# Patient Record
Sex: Female | Born: 1955 | Race: White | Hispanic: No | Marital: Married | Smoking: Never smoker
Health system: Southern US, Community
[De-identification: ages and names within clinical notes are randomized; demographics above are authoritative.]

## PROBLEM LIST (undated history)

## (undated) DIAGNOSIS — E039 Hypothyroidism, unspecified: Secondary | ICD-10-CM

## (undated) DIAGNOSIS — E785 Hyperlipidemia, unspecified: Secondary | ICD-10-CM

## (undated) DIAGNOSIS — E079 Disorder of thyroid, unspecified: Secondary | ICD-10-CM

## (undated) DIAGNOSIS — N183 Chronic kidney disease, stage 3 unspecified: Secondary | ICD-10-CM

## (undated) DIAGNOSIS — I10 Essential (primary) hypertension: Secondary | ICD-10-CM

## (undated) DIAGNOSIS — R112 Nausea with vomiting, unspecified: Secondary | ICD-10-CM

## (undated) DIAGNOSIS — Z9889 Other specified postprocedural states: Secondary | ICD-10-CM

## (undated) DIAGNOSIS — D519 Vitamin B12 deficiency anemia, unspecified: Secondary | ICD-10-CM

## (undated) DIAGNOSIS — E119 Type 2 diabetes mellitus without complications: Secondary | ICD-10-CM

## (undated) DIAGNOSIS — Z86010 Personal history of colonic polyps: Secondary | ICD-10-CM

## (undated) DIAGNOSIS — R350 Frequency of micturition: Secondary | ICD-10-CM

## (undated) DIAGNOSIS — N390 Urinary tract infection, site not specified: Secondary | ICD-10-CM

## (undated) HISTORY — PX: BACK SURGERY: SHX140

## (undated) HISTORY — PX: URETER SURGERY: SHX823

## (undated) HISTORY — PX: ABDOMINAL HYSTERECTOMY: SHX81

## (undated) HISTORY — PX: COLONOSCOPY: SHX174

## (undated) HISTORY — DX: Disorder of thyroid, unspecified: E07.9

---

## 2006-12-22 HISTORY — PX: LUMBAR SPINE SURGERY: SHX701

## 2013-03-16 HISTORY — PX: LAPAROSCOPIC ASSISTED VAGINAL HYSTERECTOMY: SHX5398

## 2016-12-22 HISTORY — PX: CATARACT EXTRACTION W/ INTRAOCULAR LENS IMPLANT: SHX1309

## 2020-07-22 HISTORY — PX: COLONOSCOPY: SHX174

## 2020-07-28 ENCOUNTER — Other Ambulatory Visit: Payer: Self-pay

## 2020-07-28 ENCOUNTER — Emergency Department (HOSPITAL_BASED_OUTPATIENT_CLINIC_OR_DEPARTMENT_OTHER)
Admission: EM | Admit: 2020-07-28 | Discharge: 2020-07-28 | Disposition: A | Discharge status: Home / Self Care | Payer: BC Managed Care – PPO | Attending: Emergency Medicine | Admitting: Emergency Medicine

## 2020-07-28 ENCOUNTER — Encounter (HOSPITAL_BASED_OUTPATIENT_CLINIC_OR_DEPARTMENT_OTHER): Payer: Self-pay | Admitting: *Deleted

## 2020-07-28 ENCOUNTER — Emergency Department (HOSPITAL_BASED_OUTPATIENT_CLINIC_OR_DEPARTMENT_OTHER): Payer: BC Managed Care – PPO

## 2020-07-28 DIAGNOSIS — R0789 Other chest pain: Secondary | ICD-10-CM | POA: Diagnosis present

## 2020-07-28 DIAGNOSIS — M545 Low back pain: Secondary | ICD-10-CM | POA: Insufficient documentation

## 2020-07-28 DIAGNOSIS — E119 Type 2 diabetes mellitus without complications: Secondary | ICD-10-CM | POA: Diagnosis not present

## 2020-07-28 DIAGNOSIS — Z87891 Personal history of nicotine dependence: Secondary | ICD-10-CM | POA: Diagnosis not present

## 2020-07-28 DIAGNOSIS — I10 Essential (primary) hypertension: Secondary | ICD-10-CM | POA: Diagnosis not present

## 2020-07-28 HISTORY — DX: Essential (primary) hypertension: I10

## 2020-07-28 LAB — CBC WITH DIFFERENTIAL/PLATELET
Abs Immature Granulocytes: 0.03 10*3/uL (ref 0.00–0.07)
Basophils Absolute: 0.1 10*3/uL (ref 0.0–0.1)
Basophils Relative: 1 %
Eosinophils Absolute: 0.2 10*3/uL (ref 0.0–0.5)
Eosinophils Relative: 3 %
HCT: 42.1 % (ref 36.0–46.0)
Hemoglobin: 14 g/dL (ref 12.0–15.0)
Immature Granulocytes: 0 %
Lymphocytes Relative: 35 %
Lymphs Abs: 2.5 10*3/uL (ref 0.7–4.0)
MCH: 32.3 pg (ref 26.0–34.0)
MCHC: 33.3 g/dL (ref 30.0–36.0)
MCV: 97.2 fL (ref 80.0–100.0)
Monocytes Absolute: 0.6 10*3/uL (ref 0.1–1.0)
Monocytes Relative: 8 %
Neutro Abs: 3.7 10*3/uL (ref 1.7–7.7)
Neutrophils Relative %: 53 %
Platelets: 259 10*3/uL (ref 150–400)
RBC: 4.33 MIL/uL (ref 3.87–5.11)
RDW: 11.9 % (ref 11.5–15.5)
WBC: 7 10*3/uL (ref 4.0–10.5)
nRBC: 0 % (ref 0.0–0.2)

## 2020-07-28 LAB — COMPREHENSIVE METABOLIC PANEL
ALT: 25 U/L (ref 0–44)
AST: 19 U/L (ref 15–41)
Albumin: 4.4 g/dL (ref 3.5–5.0)
Alkaline Phosphatase: 68 U/L (ref 38–126)
Anion gap: 8 (ref 5–15)
BUN: 16 mg/dL (ref 8–23)
CO2: 26 mmol/L (ref 22–32)
Calcium: 9.3 mg/dL (ref 8.9–10.3)
Chloride: 103 mmol/L (ref 98–111)
Creatinine, Ser: 1.15 mg/dL — ABNORMAL HIGH (ref 0.44–1.00)
GFR calc Af Amer: 58 mL/min — ABNORMAL LOW (ref >60)
GFR calc non Af Amer: 50 mL/min — ABNORMAL LOW (ref >60)
Glucose, Bld: 128 mg/dL — ABNORMAL HIGH (ref 70–99)
Potassium: 3.4 mmol/L — ABNORMAL LOW (ref 3.5–5.1)
Sodium: 137 mmol/L (ref 135–145)
Total Bilirubin: 0.3 mg/dL (ref 0.3–1.2)
Total Protein: 7.9 g/dL (ref 6.5–8.1)

## 2020-07-28 LAB — TROPONIN I (HIGH SENSITIVITY)
Troponin I (High Sensitivity): 9 ng/L (ref <18)
Troponin I (High Sensitivity): 9 ng/L (ref <18)

## 2020-07-28 MED ORDER — NITROGLYCERIN 0.4 MG SL SUBL
0.4000 mg | SUBLINGUAL_TABLET | SUBLINGUAL | Status: DC | PRN
  Administered 2020-07-28: 0.4 mg via SUBLINGUAL
  Filled 2020-07-28: qty 1

## 2020-07-28 NOTE — ED Notes (Signed)
Having chest pain since yesterday am, feels pressure across chest a breast area, states when taking a deep breath, pain increases. Pain begins a center at chest and radiates to back.

## 2020-07-28 NOTE — ED Notes (Signed)
Taken to Xray at this time

## 2020-07-28 NOTE — ED Notes (Addendum)
Significant drop in BP after giving 1 ntg sl.  Denies pain unless moving around.  Encouraged to call for assistance as needed.  Provider made aware.

## 2020-07-28 NOTE — ED Provider Notes (Signed)
8:50 PM signout from Raymond PA-C at shift change.  Patient awaiting second troponin.  This returned at 9 and is unchanged from previous.  Patient updated on results.  Her chest pain is atypical.  She has pain at room might her when she had pleurisy in the past.  Is worse with deep breathing.  She denies shortness of breath.  She has not had vomiting, diaphoresis, or radiation of pain.  No lower extremity symptoms or clinical signs of DVT.  I personally reviewed EKG and chest x-ray.  Patient reports being under a lot of stress.  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  She thinks that a lot of her symptoms are related to stress.  At this point, she is comfortable discharged home.  Strongly encourage PCP follow-up for recheck of symptoms.  Patient was counseled to return with severe chest pain, especially if the pain is crushing or pressure-like and spreads to the arms, back, neck, or jaw, or if they have sweating, nausea, or shortness of breath with the pain. They were encouraged to call 911 with these symptoms.   They were also told to return if their chest pain gets worse and does not go away with rest, they have an attack of chest pain lasting longer than usual despite rest and treatment with the medications their caregiver has prescribed, if they wake from sleep with chest pain or shortness of breath, if they feel dizzy or faint, if they have chest pain not typical of their usual pain, or if they have any other emergent concerns regarding their health.  The patient verbalized understanding and agreed.   BP (!) 143/94 (BP Location: Right Arm)   Pulse 73   Temp 97.9 F (36.6 C) (Oral)   Resp 15   Ht 5\' 9"  (1.753 m)   Wt 70.3 kg   SpO2 100%   BMI 22.89 kg/m     , PA-C 07/28/20 2052    2053, MD 07/29/20 [phone]

## 2020-07-28 NOTE — ED Provider Notes (Signed)
[facility] EMERGENCY DEPARTMENT Provider Note   CSN: [id number] Arrival date & time: 07/28/20  1624     History Chief Complaint  Patient presents with  . Chest Pain    Danelia Snodgrass is a 64 y.o. female w PMHx T2DM, HTN, presenting to the ED with complaint of constant central chest pressure since yesterday morning. She states pain is worse with exertion, better with laying down and resting. Pain radiates to her back. Denies assoc nausea, diaphoresis, SOB. She feels like she is taking shallow breaths due to the discomfort, though is not short of breath. She states she has a hx of pleurisy and this feels similar. No recent illness, cough, fever. No prolonged immobilization, no new leg swelling pain. No hx DVT/PE. No anticoagulants.  She reports increased stress level/anxiety this summer.  The history is provided by the patient.    HPI: A 64 year old patient with a history of treated diabetes and hypertension presents for evaluation of chest pain. Initial onset of pain was more than 6 hours ago. The patient's chest pain is described as heaviness/pressure/tightness and is worse with exertion. The patient's chest pain is middle- or left-sided, is not well-localized, is not sharp and does not radiate to the arms/jaw/neck. The patient does not complain of nausea and denies diaphoresis. The patient has no history of stroke, has no history of peripheral artery disease, has not smoked in the past 90 days, has no relevant family history of coronary artery disease (first degree relative at less than age 95), has no history of hypercholesterolemia and does not have an elevated BMI (>=30).   Past Medical History:  Diagnosis Date  . Hypertension   . Thyroid disease     There are no problems to display for this patient.   Past Surgical History:  Procedure Laterality Date  . ABDOMINAL HYSTERECTOMY    . BACK SURGERY    . COLONOSCOPY    . URETER SURGERY       OB History   No obstetric  history on file.     No family history on file.  Social History   Tobacco Use  . Smoking status: Former Games developer  . Smokeless tobacco: Never Used  Vaping Use  . Vaping Use: Never used  Substance Use Topics  . Alcohol use: Yes  . Drug use: Never    Home Medications Prior to Admission medications   Medication Sig Start Date End Date Taking? Authorizing Provider  LIOTHYRONINE SODIUM PO Take by mouth.   Yes [provider]  zinc gluconate 50 MG tablet Take by mouth.    [provider]    Allergies    Patient has no known allergies.  Review of Systems   Review of Systems  All other systems reviewed and are negative.   Physical Exam Updated Vital Signs BP (!) 164/92 (BP Location: Right Arm)   Pulse 93   Temp 98.6 F (37 C) (Oral)   Resp 18   Ht 5\' 9"  (1.753 m)   Wt 70.3 kg   SpO2 100%   BMI 22.89 kg/m   Physical Exam Vitals and nursing note reviewed.  Constitutional:      General: She is not in acute distress.    Appearance: She is well-developed. She is not ill-appearing.  HENT:     Head: Normocephalic and atraumatic.  Eyes:     Conjunctiva/sclera: Conjunctivae normal.  Cardiovascular:     Rate and Rhythm: Normal rate and regular rhythm.  Heart sounds: Normal heart sounds.     Comments: Normal and equal radial and DP pulses b/l Pulmonary:     Effort: Pulmonary effort is normal. No respiratory distress.     Breath sounds: Normal breath sounds.     Comments: Pressing on chest improves symptoms Chest:     Chest wall: No tenderness.  Abdominal:     General: Bowel sounds are normal.     Palpations: Abdomen is soft.     Tenderness: There is no abdominal tenderness.  Musculoskeletal:     Right lower leg: No edema.     Left lower leg: No edema.     Comments: No calf TTP  Skin:    General: Skin is warm.  Neurological:     Mental Status: She is alert.  Psychiatric:        Behavior: Behavior normal.     ED Results / Procedures /  Treatments   Labs (all labs ordered are listed, but only abnormal results are displayed) Labs Reviewed  COMPREHENSIVE METABOLIC PANEL - Abnormal; Notable for the following components:      Result Value   Potassium 3.4 (*)    Glucose, Bld 128 (*)    Creatinine, Ser 1.15 (*)    GFR calc non Af Amer 50 (*)    GFR calc Af Amer 58 (*)    All other components within normal limits  CBC WITH DIFFERENTIAL/PLATELET  TROPONIN I (HIGH SENSITIVITY)  TROPONIN I (HIGH SENSITIVITY)    EKG EKG Interpretation  Date/Time:  Saturday July 28 2020 16:50:01 EDT Ventricular Rate:  80 PR Interval:  154 QRS Duration: 74 QT Interval:  368 QTC Calculation: 424 R Axis:   76 Text Interpretation: Normal sinus rhythm Nonspecific ST abnormality Abnormal ECG No previous ECGs available Confirmed by Richardean Canal [id number]) on 07/28/2020 6:42:32 PM   Radiology DG Chest 2 View  Result Date: 07/28/2020 CLINICAL DATA:  Chest pressure. EXAM: CHEST - 2 VIEW COMPARISON:  None. FINDINGS: There is no evidence of acute infiltrate, pleural effusion or pneumothorax. The heart size and mediastinal contours are within normal limits. The visualized skeletal structures are unremarkable. IMPRESSION: No active cardiopulmonary disease. Electronically Signed   By: Aram Candela M.D.   On: 07/28/2020 17:57    Procedures Procedures (including critical care time)  Medications Ordered in ED Medications  nitroGLYCERIN (NITROSTAT) SL tablet 0.4 mg (has no administration in time range)    ED Course  I have reviewed the triage vital signs and the nursing notes.  Pertinent labs & imaging results that were available during my care of the patient were reviewed by me and considered in my medical decision making (see chart for details).    MDM Rules/Calculators/A&P HEAR Score: 4                        Pt presenting with constant central chest pressure that began yesterday morning. Pain is worse with exertion and better with rest,  radiates to her back.  No associated nausea or diaphoresis.  She endorses increased level of stress and anxiety over the summer.  Low risk [place], no recent immobilization or surgery.  No unilateral leg pain or swelling, no history of DVT or PE.   On exam, patient is well-appearing and in no distress.  Heart and lung sounds are normal.  No chest tenderness though symptoms be improving with chest palpation.  No leg swelling or calf tenderness.  Equal pulses both radial and  DP.  Cardiac work-up thus far reveals negative chest x-ray, negative troponin of 9, creatinine 1.15.  EKG with nonspecific ST changes, though unsure of baseline as no previous to compare.  Hear score of 4, per care everywhere PCP diagnosed with type 2 diabetes and hypertension.  Patient reports type 2 diabetes was a new diagnosis and she is managing with diet at this point.    Concern for possible angina as cause of patients pain. Also consider anxiety. Low suspicion for PE or dissection.  Care handed off at shift change pending delta troponin. PA Geiple to assume care and determine appropriate disposition.  Final Clinical Impression(s) / ED Diagnoses  Final diagnoses:      Rx / DC Orders ED Discharge Orders    None       Erilyn Pearman, [place] N, PA-C 07/28/20 1911    Charlynne Pander, MD 07/29/20 [phone]

## 2020-07-28 NOTE — ED Triage Notes (Addendum)
Pt reports she had a colonoscopy this week on Thursday and felt fine after. Since Friday she states she's been having chest pressure which radiates into her back. Reports it feels like pleurisy and she thinks it is related to the colonoscopy. Pt awake, alert, NAD. Ambulated with quick steady gait to triage

## 2020-07-28 NOTE — ED Notes (Signed)
Placed on cont cardiac monitoring with cont POX and int NBP assessments 

## 2020-07-28 NOTE — Discharge Instructions (Addendum)
Please read instructions below. Your chest x-ray did not show any signs of pneumonia or other problems with your lungs.  Blood test to check for heart muscle irritation were both normal.  Your EKG did not show any signs of heart attack. Follow up with your primary care provider this week. Return to the ER for new or worsening symptoms; including worsening chest pain, shortness of breath, pain that radiates to the arm or neck, pain or shortness of breath worsened with exertion.

## 2020-07-28 NOTE — ED Notes (Signed)
Denies N/V/D, denies fever, denies lightheadiness as well, denies having a cough,

## 2021-04-26 IMAGING — CR DG CHEST 2V
2 series · 2 of 2 positions shown · non-contrast
Comparison: None.

CLINICAL DATA: Chest pressure.

EXAM:
CHEST - 2 VIEW

[w chest pa]
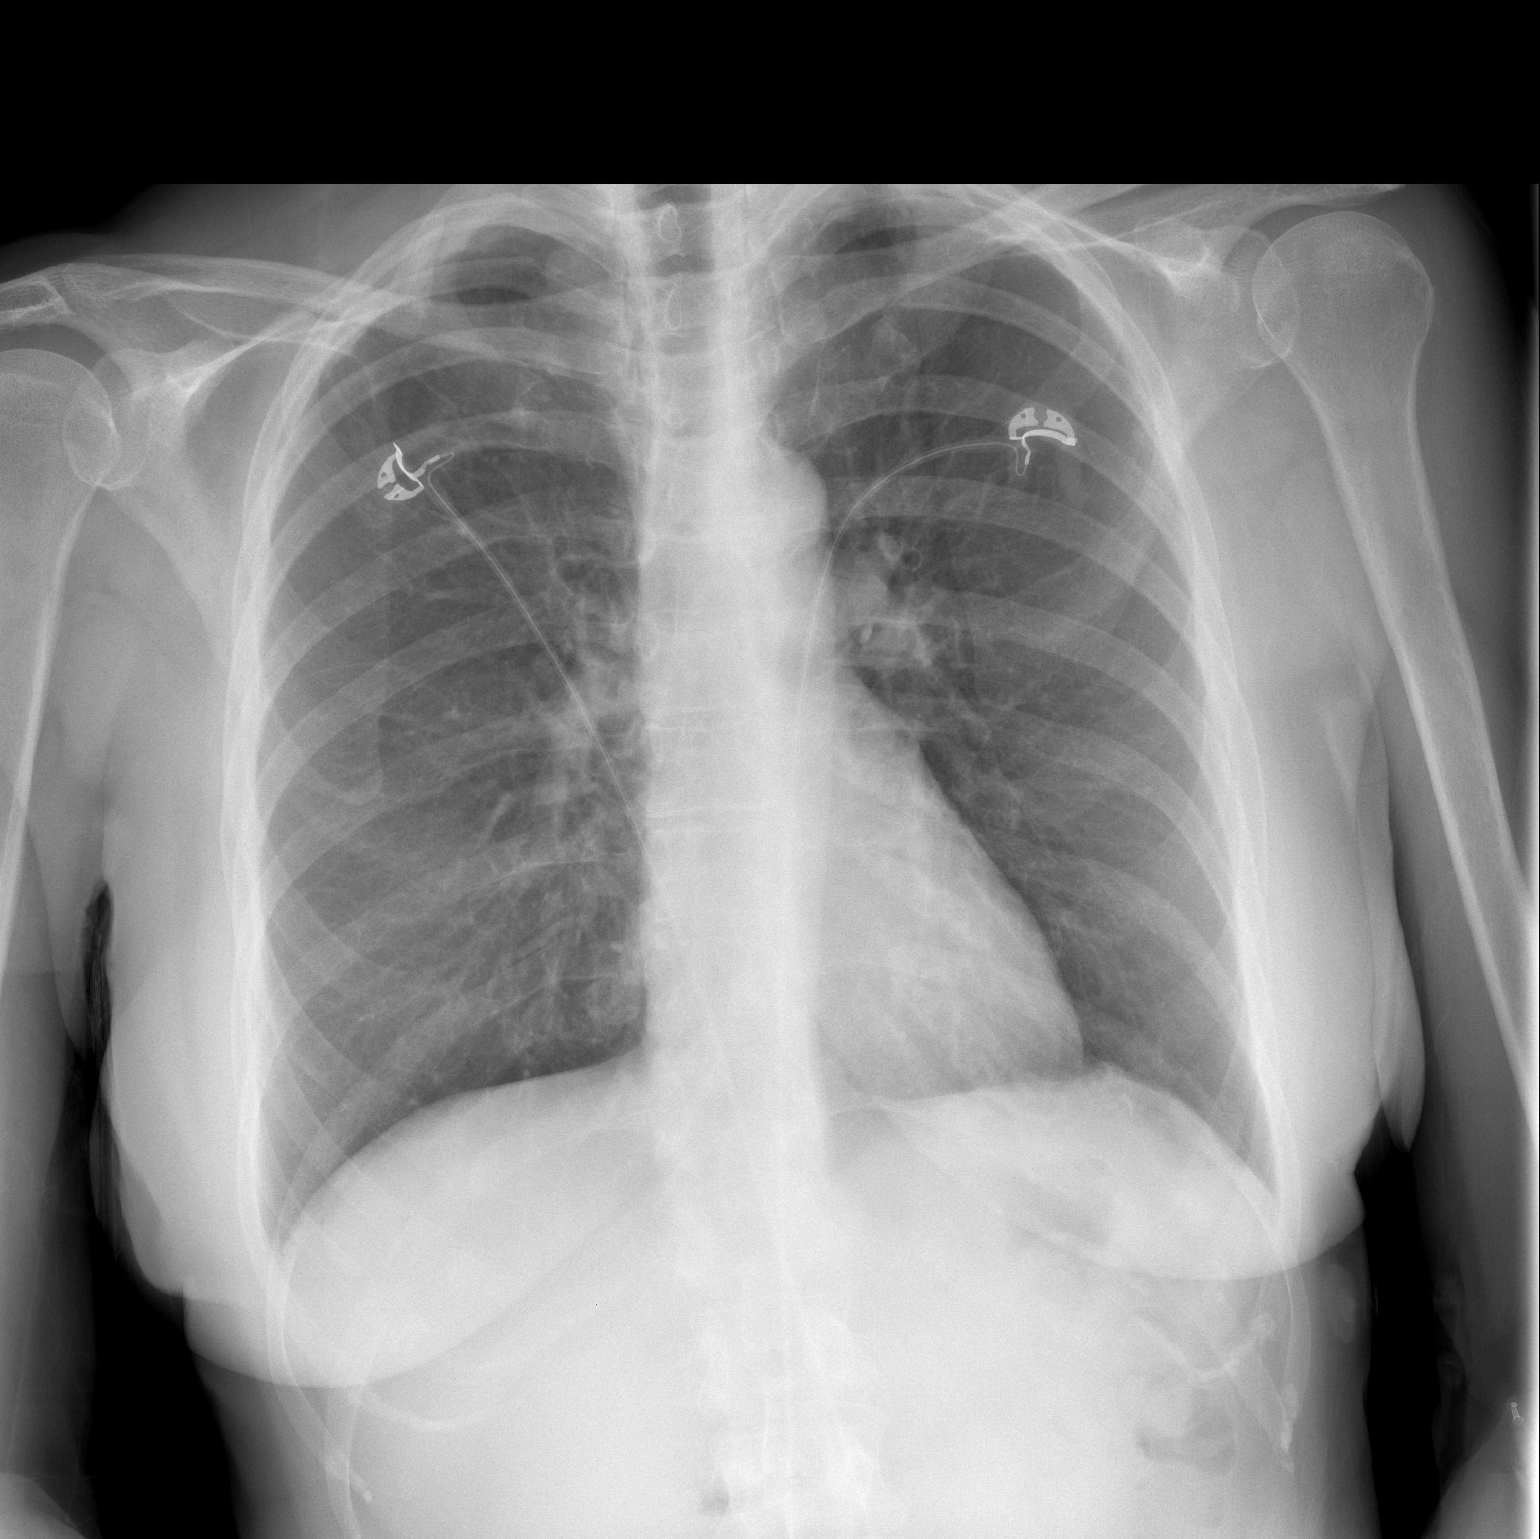

[w chest lat]
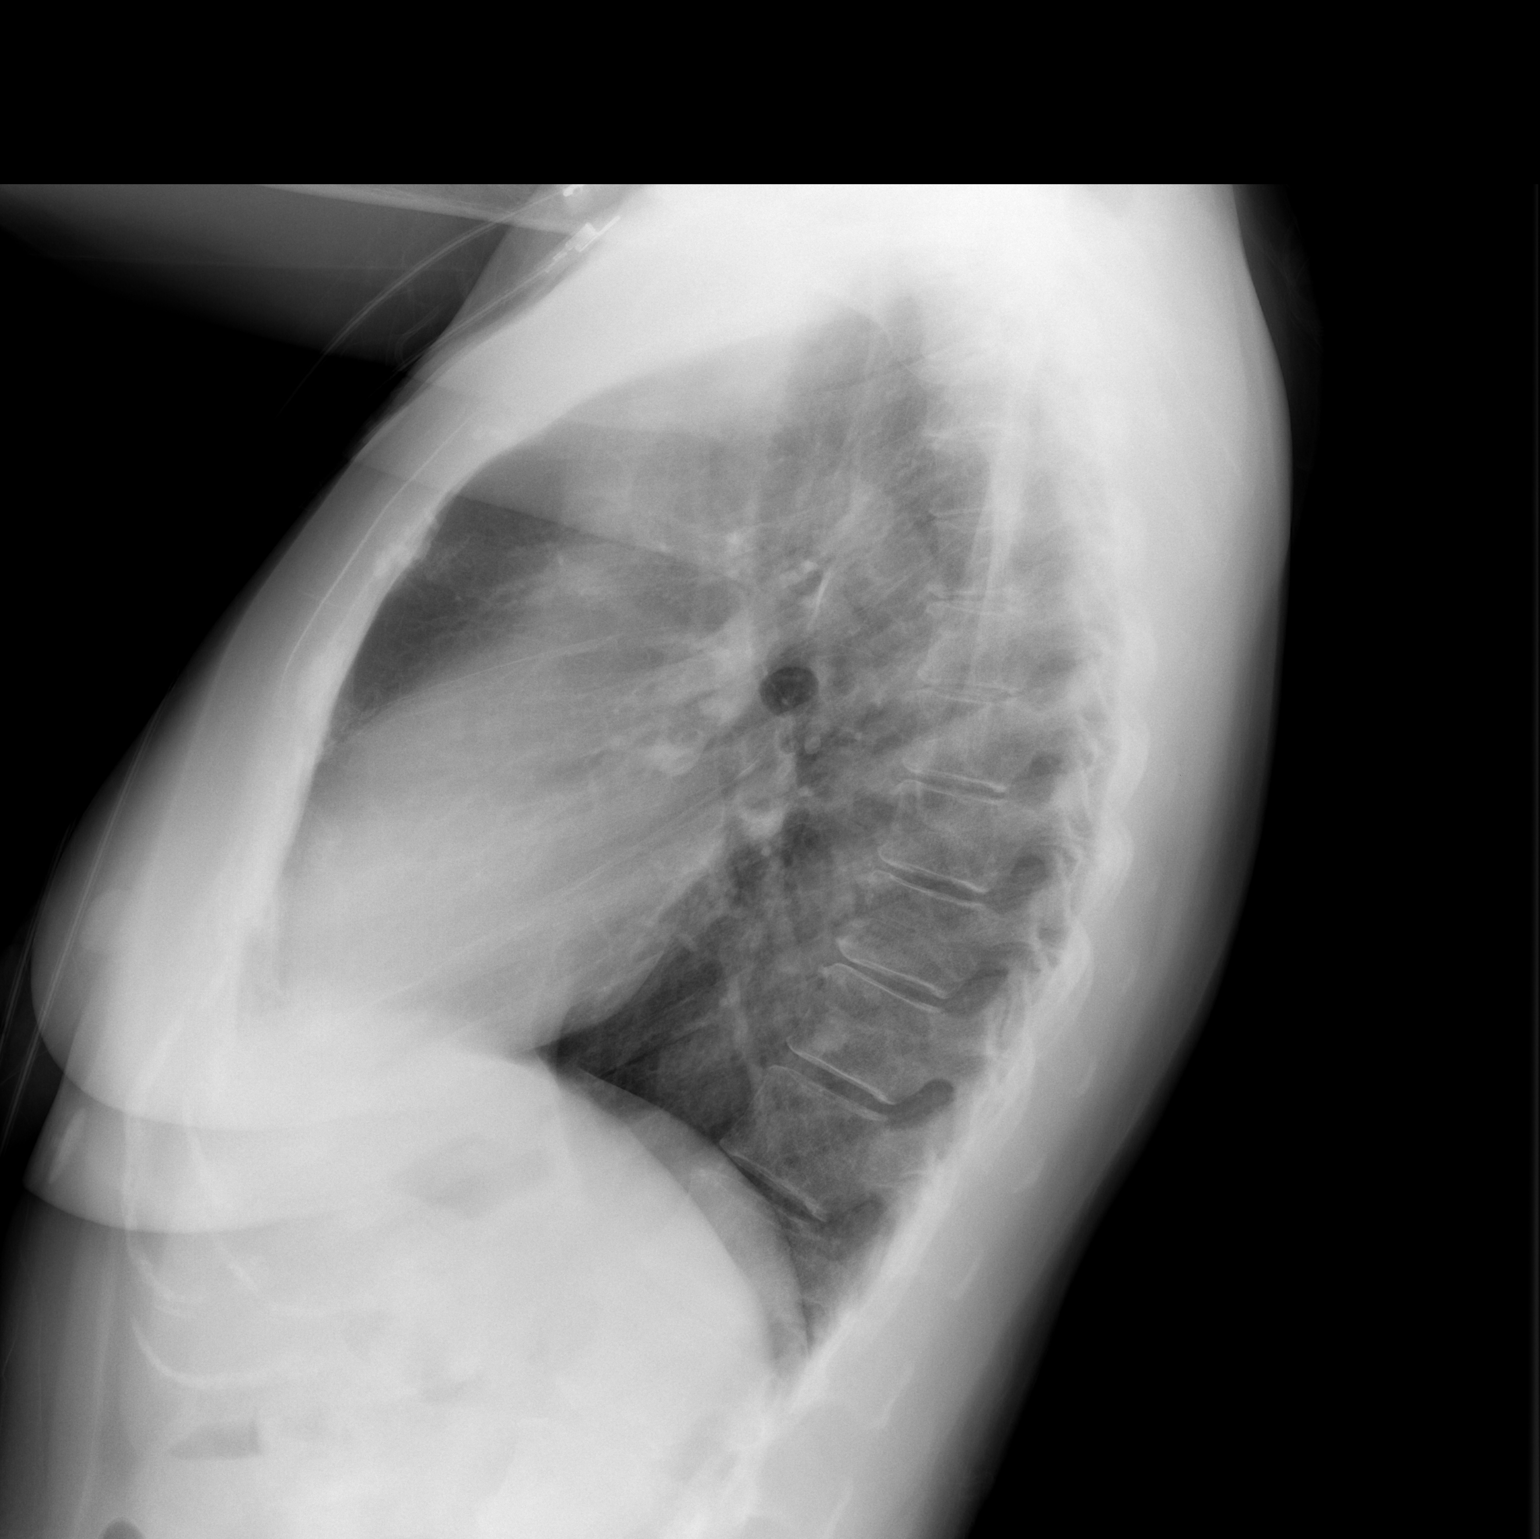

[2 of 2 positions shown; findings below may reference images not displayed]

FINDINGS: There is no evidence of acute infiltrate, pleural effusion or
pneumothorax. The heart size and mediastinal contours are within
normal limits. The visualized skeletal structures are unremarkable.
IMPRESSION: No active cardiopulmonary disease.

## 2021-07-22 ENCOUNTER — Emergency Department (HOSPITAL_BASED_OUTPATIENT_CLINIC_OR_DEPARTMENT_OTHER)
Admission: EM | Admit: 2021-07-22 | Discharge: 2021-07-22 | Disposition: A | Discharge status: Home / Self Care | Payer: Medicare Other | Attending: Emergency Medicine | Admitting: Emergency Medicine

## 2021-07-22 ENCOUNTER — Encounter (HOSPITAL_BASED_OUTPATIENT_CLINIC_OR_DEPARTMENT_OTHER): Payer: Self-pay

## 2021-07-22 ENCOUNTER — Other Ambulatory Visit: Payer: Self-pay

## 2021-07-22 DIAGNOSIS — R0602 Shortness of breath: Secondary | ICD-10-CM | POA: Diagnosis present

## 2021-07-22 DIAGNOSIS — I10 Essential (primary) hypertension: Secondary | ICD-10-CM | POA: Diagnosis not present

## 2021-07-22 DIAGNOSIS — Z87891 Personal history of nicotine dependence: Secondary | ICD-10-CM | POA: Insufficient documentation

## 2021-07-22 DIAGNOSIS — Z8616 Personal history of COVID-19: Secondary | ICD-10-CM

## 2021-07-22 DIAGNOSIS — M5431 Sciatica, right side: Secondary | ICD-10-CM | POA: Diagnosis not present

## 2021-07-22 DIAGNOSIS — U071 COVID-19: Secondary | ICD-10-CM | POA: Insufficient documentation

## 2021-07-22 DIAGNOSIS — R1031 Right lower quadrant pain: Secondary | ICD-10-CM | POA: Diagnosis not present

## 2021-07-22 HISTORY — DX: Personal history of COVID-19: Z86.16

## 2021-07-22 LAB — CBC WITH DIFFERENTIAL/PLATELET
Abs Immature Granulocytes: 0.01 10*3/uL (ref 0.00–0.07)
Basophils Absolute: 0 10*3/uL (ref 0.0–0.1)
Basophils Relative: 1 %
Eosinophils Absolute: 0.1 10*3/uL (ref 0.0–0.5)
Eosinophils Relative: 1 %
HCT: 41.6 % (ref 36.0–46.0)
Hemoglobin: 14.3 g/dL (ref 12.0–15.0)
Immature Granulocytes: 0 %
Lymphocytes Relative: 23 %
Lymphs Abs: 1.1 10*3/uL (ref 0.7–4.0)
MCH: 32.4 pg (ref 26.0–34.0)
MCHC: 34.4 g/dL (ref 30.0–36.0)
MCV: 94.3 fL (ref 80.0–100.0)
Monocytes Absolute: 0.8 10*3/uL (ref 0.1–1.0)
Monocytes Relative: 15 %
Neutro Abs: 2.9 10*3/uL (ref 1.7–7.7)
Neutrophils Relative %: 60 %
Platelets: 227 10*3/uL (ref 150–400)
RBC: 4.41 MIL/uL (ref 3.87–5.11)
RDW: 11.9 % (ref 11.5–15.5)
WBC: 4.9 10*3/uL (ref 4.0–10.5)
nRBC: 0 % (ref 0.0–0.2)

## 2021-07-22 LAB — COMPREHENSIVE METABOLIC PANEL
ALT: 20 U/L (ref 0–44)
AST: 21 U/L (ref 15–41)
Albumin: 3.8 g/dL (ref 3.5–5.0)
Alkaline Phosphatase: 65 U/L (ref 38–126)
Anion gap: 9 (ref 5–15)
BUN: 17 mg/dL (ref 8–23)
CO2: 22 mmol/L (ref 22–32)
Calcium: 8.5 mg/dL — ABNORMAL LOW (ref 8.9–10.3)
Chloride: 103 mmol/L (ref 98–111)
Creatinine, Ser: 1.31 mg/dL — ABNORMAL HIGH (ref 0.44–1.00)
GFR, Estimated: 45 mL/min — ABNORMAL LOW (ref >60)
Glucose, Bld: 206 mg/dL — ABNORMAL HIGH (ref 70–99)
Potassium: 4 mmol/L (ref 3.5–5.1)
Sodium: 134 mmol/L — ABNORMAL LOW (ref 135–145)
Total Bilirubin: 0.5 mg/dL (ref 0.3–1.2)
Total Protein: 7.2 g/dL (ref 6.5–8.1)

## 2021-07-22 LAB — RESP PANEL BY RT-PCR (FLU A&B, COVID) ARPGX2
Influenza A by PCR: NEGATIVE
Influenza B by PCR: NEGATIVE
SARS Coronavirus 2 by RT PCR: POSITIVE — AB

## 2021-07-22 LAB — TSH: TSH: 0.724 u[IU]/mL (ref 0.350–4.500)

## 2021-07-22 LAB — D-DIMER, QUANTITATIVE: D-Dimer, Quant: <0.27 ug/mL-FEU (ref 0.00–0.50)

## 2021-07-22 MED ORDER — MORPHINE SULFATE (PF) 4 MG/ML IV SOLN
4.0000 mg | Freq: Once | INTRAVENOUS | Status: AC
  Administered 2021-07-22: 4 mg via INTRAVENOUS
  Filled 2021-07-22: qty 1

## 2021-07-22 MED ORDER — DEXAMETHASONE SODIUM PHOSPHATE 10 MG/ML IJ SOLN
10.0000 mg | Freq: Once | INTRAMUSCULAR | Status: AC
  Administered 2021-07-22: 10 mg via INTRAVENOUS
  Filled 2021-07-22: qty 1

## 2021-07-22 MED ORDER — PAXLOVID 10 X 150 MG & 10 X 100MG PO TBPK: 2.0000 | ORAL_TABLET | Freq: Two times a day (BID) | ORAL | 0 refills | Status: AC

## 2021-07-22 MED ORDER — SODIUM CHLORIDE 0.9 % IV BOLUS
500.0000 mL | Freq: Once | INTRAVENOUS | Status: AC
  Administered 2021-07-22: 500 mL via INTRAVENOUS

## 2021-07-22 MED ORDER — ONDANSETRON HCL 4 MG/2ML IJ SOLN
4.0000 mg | Freq: Once | INTRAMUSCULAR | Status: AC
  Administered 2021-07-22: 4 mg via INTRAVENOUS
  Filled 2021-07-22: qty 2

## 2021-07-22 MED ORDER — ONDANSETRON 4 MG PO TBDP
4.0000 mg | ORAL_TABLET | Freq: Three times a day (TID) | ORAL | 0 refills | Status: DC | PRN
Start: 2021-07-22 — End: 2022-05-01

## 2021-07-22 MED ORDER — HYDROCODONE-ACETAMINOPHEN 5-325 MG PO TABS: 1.0000 | ORAL_TABLET | ORAL | 0 refills | Status: DC | PRN

## 2021-07-22 NOTE — ED Triage Notes (Signed)
Pt c/o pain to right buttock/hip, right LE x 1 mont-states pain worse and right groin pain x 4 days-denies injury-NAD-steady gait

## 2021-07-22 NOTE — Discharge Instructions (Addendum)
Person Under Monitoring Name: Mariah Russell  Location: [address] [place] [address]   Infection Prevention Recommendations for Individuals Confirmed to have, or Being Evaluated for, 2019 Novel Coronavirus (COVID-19) Infection Who Receive Care at Home  Individuals who are confirmed to have, or are being evaluated for, COVID-19 should follow the prevention steps below until a healthcare provider or local or state health department says they can return to normal activities.  Stay home except to get medical care You should restrict activities outside your home, except for getting medical care. Do not go to work, school, or public areas, and do not use public transportation or taxis.  Call ahead before visiting your doctor Before your medical appointment, call the healthcare provider and tell them that you have, or are being evaluated for, COVID-19 infection. This will help the healthcare provider's office take steps to keep other people from getting infected. Ask your healthcare provider to call the local or state health department.  Monitor your symptoms Seek prompt medical attention if your illness is worsening (e.g., difficulty breathing). Before going to your medical appointment, call the healthcare provider and tell them that you have, or are being evaluated for, COVID-19 infection. Ask your healthcare provider to call the local or state health department.  Wear a facemask You should wear a facemask that covers your nose and mouth when you are in the same room with other people and when you visit a healthcare provider. People who live with or visit you should also wear a facemask while they are in the same room with you.  Separate yourself from other people in your home As much as possible, you should stay in a different room from other people in your home. Also, you should use a separate bathroom, if available.  Avoid sharing household items You should not  share dishes, drinking glasses, cups, eating utensils, towels, bedding, or other items with other people in your home. After using these items, you should wash them thoroughly with soap and water.  Cover your coughs and sneezes Cover your mouth and nose with a tissue when you cough or sneeze, or you can cough or sneeze into your sleeve. Throw used tissues in a lined trash can, and immediately wash your hands with soap and water for at least 20 seconds or use an alcohol-based hand rub.  Wash your [employer] your hands often and thoroughly with soap and water for at least 20 seconds. You can use an alcohol-based hand sanitizer if soap and water are not available and if your hands are not visibly dirty. Avoid touching your eyes, nose, and mouth with unwashed hands.   Prevention Steps for Caregivers and Household Members of Individuals Confirmed to have, or Being Evaluated for, COVID-19 Infection Being Cared for in the Home  If you live with, or provide care at home for, a person confirmed to have, or being evaluated for, COVID-19 infection please follow these guidelines to prevent infection:  Follow healthcare provider's instructions Make sure that you understand and can help the patient follow any healthcare provider instructions for all care.  Provide for the patient's basic needs You should help the patient with basic needs in the home and provide support for getting groceries, prescriptions, and other personal needs.  Monitor the patient's symptoms If they are getting sicker, call his or her medical provider and tell them that the patient has, or is being evaluated for, COVID-19 infection. This will help the healthcare provider's office  take steps to keep other people from getting infected. Ask the healthcare provider to call the local or state health department.  Limit the number of people who have contact with the patient If possible, have only one caregiver for the  patient. Other household members should stay in another home or place of residence. If this is not possible, they should stay in another room, or be separated from the patient as much as possible. Use a separate bathroom, if available. Restrict visitors who do not have an essential need to be in the home.  Keep older adults, very young children, and other sick people away from the patient Keep older adults, very young children, and those who have compromised immune systems or chronic health conditions away from the patient. This includes people with chronic heart, lung, or kidney conditions, diabetes, and cancer.  Ensure good ventilation Make sure that shared spaces in the home have good air flow, such as from an air conditioner or an opened window, weather permitting.  Wash your hands often Wash your hands often and thoroughly with soap and water for at least 20 seconds. You can use an alcohol based hand sanitizer if soap and water are not available and if your hands are not visibly dirty. Avoid touching your eyes, nose, and mouth with unwashed hands. Use disposable paper towels to dry your hands. If not available, use dedicated cloth towels and replace them when they become wet.  Wear a facemask and gloves Wear a disposable facemask at all times in the room and gloves when you touch or have contact with the patient's blood, body fluids, and/or secretions or excretions, such as sweat, saliva, sputum, nasal mucus, vomit, urine, or feces.  Ensure the mask fits over your nose and mouth tightly, and do not touch it during use. Throw out disposable facemasks and gloves after using them. Do not reuse. Wash your hands immediately after removing your facemask and gloves. If your personal clothing becomes contaminated, carefully remove clothing and launder. Wash your hands after handling contaminated clothing. Place all used disposable facemasks, gloves, and other waste in a lined container before  disposing them with other household waste. Remove gloves and wash your hands immediately after handling these items.  Do not share dishes, glasses, or other household items with the patient Avoid sharing household items. You should not share dishes, drinking glasses, cups, eating utensils, towels, bedding, or other items with a patient who is confirmed to have, or being evaluated for, COVID-19 infection. After the person uses these items, you should wash them thoroughly with soap and water.  Wash laundry thoroughly Immediately remove and wash clothes or bedding that have blood, body fluids, and/or secretions or excretions, such as sweat, saliva, sputum, nasal mucus, vomit, urine, or feces, on them. Wear gloves when handling laundry from the patient. Read and follow directions on labels of laundry or clothing items and detergent. In general, wash and dry with the warmest temperatures recommended on the label.  Clean all areas the individual has used often Clean all touchable surfaces, such as counters, tabletops, doorknobs, bathroom fixtures, toilets, phones, keyboards, tablets, and bedside tables, every day. Also, clean any surfaces that may have blood, body fluids, and/or secretions or excretions on them. Wear gloves when cleaning surfaces the patient has come in contact with. Use a diluted bleach solution (e.g., dilute bleach with 1 part bleach and 10 parts water) or a household disinfectant with a label that says EPA-registered for coronaviruses. To make a bleach  solution at home, add 1 tablespoon of bleach to 1 quart (4 cups) of water. For a larger supply, add  cup of bleach to 1 gallon (16 cups) of water. Read labels of cleaning products and follow recommendations provided on product labels. Labels contain instructions for safe and effective use of the cleaning product including precautions you should take when applying the product, such as wearing gloves or eye protection and making sure you  have good ventilation during use of the product. Remove gloves and wash hands immediately after cleaning.  Monitor yourself for signs and symptoms of illness Caregivers and household members are considered close contacts, should monitor their health, and will be asked to limit movement outside of the home to the extent possible. Follow the monitoring steps for close contacts listed on the symptom monitoring form.   ? If you have additional questions, contact your local health department or call the epidemiologist on call at [phone] (available 24/7). ? This guidance is subject to change. For the most up-to-date guidance from [facility], please refer to their website: [email or web address]

## 2021-07-22 NOTE — ED Provider Notes (Addendum)
[facility] EMERGENCY DEPARTMENT Provider Note   CSN: [id number] Arrival date & time: 07/22/21  1302     History Chief Complaint  Patient presents with   Multiple c/o    Mariah Russell is a 65 y.o. female.  Pt presents to the ED today with pain in right hip which is radiating down her right leg.  It has been going on for about 1 month, but it is getting worse.  Pt has some pain in her right groin.  She feels a little sob.  She took an old oxycodone on Saturday (7/30) and that made her throw up.  She said she called her doctor to be seen, but they could not see her until January.  Pt also feels a little sob, but thinks that is from the pain.      Past Medical History:  Diagnosis Date   Hypertension    Thyroid disease     There are no problems to display for this patient.   Past Surgical History:  Procedure Laterality Date   ABDOMINAL HYSTERECTOMY     BACK SURGERY     COLONOSCOPY     URETER SURGERY       OB History   No obstetric history on file.     No family history on file.  Social History   Tobacco Use   Smoking status: Former   Smokeless tobacco: Never  Building services engineer Use: Never used  Substance Use Topics   Alcohol use: Yes    Comment: weekly   Drug use: Never    Home Medications Prior to Admission medications   Medication Sig Start Date End Date Taking? Authorizing Provider  HYDROcodone-acetaminophen (NORCO/VICODIN) 5-325 MG tablet Take 1 tablet by mouth every 4 (four) hours as needed. 07/22/21  Yes Jacalyn Lefevre, MD  nirmatrelvir/ritonavir EUA, renal dosing, (PAXLOVID) TBPK Take 2 tablets by mouth 2 (two) times daily for 5 days. Patient GFR is 45. Take nirmatrelvir (150 mg) one tablet twice daily for 5 days and ritonavir (100 mg) one tablet twice daily for 5 days. 07/22/21 07/27/21 Yes Jacalyn Lefevre, MD  ondansetron (ZOFRAN ODT) 4 MG disintegrating tablet Take 1 tablet (4 mg total) by mouth every 8 (eight) hours as needed for nausea or  vomiting. 07/22/21  Yes Jacalyn Lefevre, MD  LIOTHYRONINE SODIUM PO Take by mouth.    [provider]  zinc gluconate 50 MG tablet Take by mouth.    [provider]    Allergies    Patient has no known allergies.  Review of Systems   Review of Systems  Gastrointestinal:  Positive for abdominal pain.  Neurological:        Pain radiating down right leg  All other systems reviewed and are negative.  Physical Exam Updated Vital Signs BP (!) 148/88 (BP Location: Left Arm)   Pulse 92   Temp 98.3 F (36.8 C) (Oral)   Resp 20   Ht 5\' 9"  (1.753 m)   Wt 73 kg   SpO2 98%   BMI 23.78 kg/m   Physical Exam Vitals and nursing note reviewed.  Constitutional:      Appearance: Normal appearance.  HENT:     Head: Normocephalic and atraumatic.     Right Ear: External ear normal.     Left Ear: External ear normal.     Nose: Nose normal.     Mouth/Throat:     Mouth: Mucous membranes are moist.     Pharynx: Oropharynx is  clear.  Eyes:     Extraocular Movements: Extraocular movements intact.     Conjunctiva/sclera: Conjunctivae normal.     Pupils: Pupils are equal, round, and reactive to light.  Cardiovascular:     Rate and Rhythm: Normal rate and regular rhythm.     Pulses: Normal pulses.     Heart sounds: Normal heart sounds.  Pulmonary:     Effort: Pulmonary effort is normal.     Breath sounds: Normal breath sounds.  Abdominal:     General: Abdomen is flat. Bowel sounds are normal.     Palpations: Abdomen is soft.  Musculoskeletal:        General: Normal range of motion.     Cervical back: Normal range of motion and neck supple.  Skin:    General: Skin is warm.     Capillary Refill: Capillary refill takes less than 2 seconds.  Neurological:     General: No focal deficit present.     Mental Status: She is alert and oriented to person, place, and time.  Psychiatric:        Mood and Affect: Mood normal.        Behavior: Behavior normal.        Thought  Content: Thought content normal.        Judgment: Judgment normal.    ED Results / Procedures / Treatments   Labs (all labs ordered are listed, but only abnormal results are displayed) Labs Reviewed  RESP PANEL BY RT-PCR (FLU A&B, COVID) ARPGX2 - Abnormal; Notable for the following components:      Result Value   SARS Coronavirus 2 by RT PCR POSITIVE (*)    All other components within normal limits  COMPREHENSIVE METABOLIC PANEL - Abnormal; Notable for the following components:   Sodium 134 (*)    Glucose, Bld 206 (*)    Creatinine, Ser 1.31 (*)    Calcium 8.5 (*)    GFR, Estimated 45 (*)    All other components within normal limits  CBC WITH DIFFERENTIAL/PLATELET  D-DIMER, QUANTITATIVE  URINALYSIS, ROUTINE W REFLEX MICROSCOPIC  TSH    EKG None  Radiology No results found.  Procedures Procedures   Medications Ordered in ED Medications  sodium chloride 0.9 % bolus 500 mL (500 mLs Intravenous New Bag/Given 07/22/21 1345)  dexamethasone (DECADRON) injection 10 mg (10 mg Intravenous Given 07/22/21 1344)  ondansetron (ZOFRAN) injection 4 mg (4 mg Intravenous Given 07/22/21 1344)  morphine 4 MG/ML injection 4 mg (4 mg Intravenous Given 07/22/21 1344)    ED Course  I have reviewed the triage vital signs and the nursing notes.  Pertinent labs & imaging results that were available during my care of the patient were reviewed by me and considered in my medical decision making (see chart for details).    MDM Rules/Calculators/A&P                           Pt is Covid +.  She has been vaccinated + 2 boosters.  She may have gotten it from her fiancee.  She is not hypoxic and is nontoxic.    BS is mildly elevated.  She has no hx of dm.  She is told that she needs to get that repeated by her pcp when she is well.  I am not going to send her home with steroids due to this.  Pt's right leg pain is likely sciatica and exacerbated by the Covid.  Pt is feeling better.  She is stable for  d/c.  She is to return if worse.  F/u with pcp.  Mariah Russell was evaluated in Emergency Department on 07/22/2021 for the symptoms described in the history of present illness. She was evaluated in the context of the global COVID-19 pandemic, which necessitated consideration that the patient might be at risk for infection with the SARS-CoV-2 virus that causes COVID-19. Institutional protocols and algorithms that pertain to the evaluation of patients at risk for COVID-19 are in a state of rapid change based on information released by regulatory bodies including the CDC and federal and state organizations. These policies and algorithms were followed during the patient's care in the ED.   Final Clinical Impression(s) / ED Diagnoses Final diagnoses:  COVID-19  Sciatica of right side    Rx / DC Orders ED Discharge Orders          Ordered    nirmatrelvir/ritonavir EUA, renal dosing, (PAXLOVID) TBPK  2 times daily        07/22/21 1503    HYDROcodone-acetaminophen (NORCO/VICODIN) 5-325 MG tablet  Every 4 hours PRN        07/22/21 1503    ondansetron (ZOFRAN ODT) 4 MG disintegrating tablet  Every 8 hours PRN        07/22/21 1503             Jacalyn Lefevre, MD 07/22/21 1507    Jacalyn Lefevre, MD 07/22/21 [phone]

## 2022-05-01 ENCOUNTER — Other Ambulatory Visit: Payer: Self-pay

## 2022-05-01 ENCOUNTER — Encounter (HOSPITAL_BASED_OUTPATIENT_CLINIC_OR_DEPARTMENT_OTHER): Payer: Self-pay | Admitting: Podiatry

## 2022-05-01 NOTE — Progress Notes (Signed)
Spoke w/ via phone for pre-op interview--- pt ?Lab needs dos----  [employer] , ekg             ?Lab results------ no ?COVID test -----patient states asymptomatic no test needed ?Arrive at ------- 1100 on 05-06-2022 ?NPO after MN NO Solid Food.  Clear liquids from MN until--- 1000 ?Med rec completed ?Medications to take morning of surgery ----- liothyronine ?Diabetic medication ----- take metformin night before as usual ?Patient instructed no nail polish to be worn day of surgery ?Patient instructed to bring photo id and insurance card day of surgery ?Patient aware to have Driver (ride ) / caregiver for 24 hours after surgery --husband, woodie caine ?Patient Special Instructions ----- n/a ?Pre-Op special Istructions ----- received pt's pcp, Dr Tonny Bollman, H&P dated 04-21-2022 via fax from Dr Harriet Pho office, in chart. ?Patient verbalized understanding of instructions that were given at this phone interview. ?Patient denies shortness of breath, chest pain, fever, cough at this phone interview.  ?

## 2022-05-06 ENCOUNTER — Ambulatory Visit (HOSPITAL_BASED_OUTPATIENT_CLINIC_OR_DEPARTMENT_OTHER): Payer: Medicare Other | Admitting: Anesthesiology

## 2022-05-06 ENCOUNTER — Encounter (HOSPITAL_BASED_OUTPATIENT_CLINIC_OR_DEPARTMENT_OTHER)
Admission: RE | Disposition: A | Discharge status: Home / Self Care | Payer: Self-pay | Source: Home / Self Care | Attending: Podiatry

## 2022-05-06 ENCOUNTER — Encounter (HOSPITAL_BASED_OUTPATIENT_CLINIC_OR_DEPARTMENT_OTHER): Payer: Self-pay | Admitting: Podiatry

## 2022-05-06 ENCOUNTER — Ambulatory Visit (HOSPITAL_BASED_OUTPATIENT_CLINIC_OR_DEPARTMENT_OTHER)
Admission: RE | Admit: 2022-05-06 | Discharge: 2022-05-06 | Disposition: A | Discharge status: Home / Self Care | Payer: Medicare Other | Attending: Podiatry | Admitting: Podiatry

## 2022-05-06 ENCOUNTER — Other Ambulatory Visit: Payer: Self-pay

## 2022-05-06 ENCOUNTER — Ambulatory Visit (HOSPITAL_BASED_OUTPATIENT_CLINIC_OR_DEPARTMENT_OTHER): Payer: Medicare Other

## 2022-05-06 DIAGNOSIS — E1122 Type 2 diabetes mellitus with diabetic chronic kidney disease: Secondary | ICD-10-CM | POA: Insufficient documentation

## 2022-05-06 DIAGNOSIS — E119 Type 2 diabetes mellitus without complications: Secondary | ICD-10-CM

## 2022-05-06 DIAGNOSIS — I10 Essential (primary) hypertension: Secondary | ICD-10-CM | POA: Diagnosis not present

## 2022-05-06 DIAGNOSIS — M25571 Pain in right ankle and joints of right foot: Secondary | ICD-10-CM | POA: Insufficient documentation

## 2022-05-06 DIAGNOSIS — M25871 Other specified joint disorders, right ankle and foot: Secondary | ICD-10-CM

## 2022-05-06 DIAGNOSIS — I129 Hypertensive chronic kidney disease with stage 1 through stage 4 chronic kidney disease, or unspecified chronic kidney disease: Secondary | ICD-10-CM | POA: Diagnosis not present

## 2022-05-06 DIAGNOSIS — Z01818 Encounter for other preprocedural examination: Secondary | ICD-10-CM

## 2022-05-06 DIAGNOSIS — Z7984 Long term (current) use of oral hypoglycemic drugs: Secondary | ICD-10-CM

## 2022-05-06 DIAGNOSIS — N183 Chronic kidney disease, stage 3 unspecified: Secondary | ICD-10-CM | POA: Diagnosis not present

## 2022-05-06 DIAGNOSIS — M24574 Contracture, right foot: Secondary | ICD-10-CM | POA: Insufficient documentation

## 2022-05-06 HISTORY — DX: Vitamin B12 deficiency anemia, unspecified: D51.9

## 2022-05-06 HISTORY — PX: TENOTOMY / FLEXOR TENDON TRANSFER: SHX6629

## 2022-05-06 HISTORY — DX: Hypothyroidism, unspecified: E03.9

## 2022-05-06 HISTORY — DX: Type 2 diabetes mellitus without complications: E11.9

## 2022-05-06 HISTORY — DX: Chronic kidney disease, stage 3 unspecified: N18.30

## 2022-05-06 HISTORY — DX: Frequency of micturition: R35.0

## 2022-05-06 HISTORY — DX: Other specified postprocedural states: R11.2

## 2022-05-06 HISTORY — PX: FOOT ARTHRODESIS: SHX1655

## 2022-05-06 HISTORY — DX: Hyperlipidemia, unspecified: E78.5

## 2022-05-06 HISTORY — DX: Other specified postprocedural states: Z98.890

## 2022-05-06 HISTORY — DX: Personal history of colonic polyps: Z86.010

## 2022-05-06 HISTORY — DX: Urinary tract infection, site not specified: N39.0

## 2022-05-06 LAB — POCT I-STAT, CHEM 8
BUN: 22 mg/dL (ref 8–23)
Calcium, Ion: 1.26 mmol/L (ref 1.15–1.40)
Chloride: 103 mmol/L (ref 98–111)
Creatinine, Ser: 1.2 mg/dL — ABNORMAL HIGH (ref 0.44–1.00)
Glucose, Bld: 133 mg/dL — ABNORMAL HIGH (ref 70–99)
HCT: 40 % (ref 36.0–46.0)
Hemoglobin: 13.6 g/dL (ref 12.0–15.0)
Potassium: 4.1 mmol/L (ref 3.5–5.1)
Sodium: 138 mmol/L (ref 135–145)
TCO2: 23 mmol/L (ref 22–32)

## 2022-05-06 SURGERY — FUSION, JOINT, FOOT
Anesthesia: Regional | Site: Foot | Laterality: Right

## 2022-05-06 MED ORDER — ONDANSETRON HCL 4 MG/2ML IJ SOLN: 4.0000 mg | Freq: Once | INTRAMUSCULAR | Status: DC | PRN

## 2022-05-06 MED ORDER — MIDAZOLAM HCL 2 MG/2ML IJ SOLN
INTRAMUSCULAR | Status: AC
  Filled 2022-05-06: qty 2

## 2022-05-06 MED ORDER — KETOROLAC TROMETHAMINE 15 MG/ML IJ SOLN: 15.0000 mg | Freq: Once | INTRAMUSCULAR | Status: DC | PRN

## 2022-05-06 MED ORDER — FENTANYL CITRATE (PF) 100 MCG/2ML IJ SOLN
INTRAMUSCULAR | Status: AC
  Filled 2022-05-06: qty 2

## 2022-05-06 MED ORDER — CEFAZOLIN SODIUM-DEXTROSE 2-4 GM/100ML-% IV SOLN
2.0000 g | INTRAVENOUS | Status: AC
  Administered 2022-05-06: 2 g via INTRAVENOUS

## 2022-05-06 MED ORDER — ONDANSETRON HCL 4 MG/2ML IJ SOLN
INTRAMUSCULAR | Status: DC | PRN
  Administered 2022-05-06: 4 mg via INTRAVENOUS

## 2022-05-06 MED ORDER — AMISULPRIDE (ANTIEMETIC) 5 MG/2ML IV SOLN
10.0000 mg | Freq: Once | INTRAVENOUS | Status: AC | PRN
  Administered 2022-05-06: 10 mg via INTRAVENOUS

## 2022-05-06 MED ORDER — CEFAZOLIN SODIUM-DEXTROSE 2-4 GM/100ML-% IV SOLN
INTRAVENOUS | Status: AC
  Filled 2022-05-06: qty 100

## 2022-05-06 MED ORDER — LIDOCAINE 2% (20 MG/ML) 5 ML SYRINGE
INTRAMUSCULAR | Status: DC | PRN
  Administered 2022-05-06: 40 mg via INTRAVENOUS

## 2022-05-06 MED ORDER — ACETAMINOPHEN 325 MG PO TABS: 650.0000 mg | ORAL_TABLET | ORAL | Status: DC | PRN

## 2022-05-06 MED ORDER — BUPIVACAINE-EPINEPHRINE (PF) 0.5% -1:200000 IJ SOLN
INTRAMUSCULAR | Status: DC | PRN
  Administered 2022-05-06: 30 mL via PERINEURAL

## 2022-05-06 MED ORDER — MIDAZOLAM HCL 2 MG/2ML IJ SOLN
2.0000 mg | Freq: Once | INTRAMUSCULAR | Status: AC
Start: 2022-05-06 — End: 2022-05-06
  Administered 2022-05-06: 2 mg via INTRAVENOUS

## 2022-05-06 MED ORDER — OXYCODONE-ACETAMINOPHEN 5-325 MG PO TABS: 1.0000 | ORAL_TABLET | ORAL | Status: DC | PRN

## 2022-05-06 MED ORDER — PROMETHAZINE HCL 25 MG PO TABS: 12.5000 mg | ORAL_TABLET | ORAL | Status: DC | PRN

## 2022-05-06 MED ORDER — AMISULPRIDE (ANTIEMETIC) 5 MG/2ML IV SOLN
INTRAVENOUS | Status: AC
  Filled 2022-05-06: qty 4

## 2022-05-06 MED ORDER — CHLORHEXIDINE GLUCONATE 4 % EX LIQD: 60.0000 mL | Freq: Once | CUTANEOUS | Status: DC

## 2022-05-06 MED ORDER — LIDOCAINE HCL (PF) 2 % IJ SOLN
INTRAMUSCULAR | Status: AC
  Filled 2022-05-06: qty 5

## 2022-05-06 MED ORDER — ACETAMINOPHEN 500 MG PO TABS
ORAL_TABLET | ORAL | Status: AC
  Filled 2022-05-06: qty 2

## 2022-05-06 MED ORDER — ACETAMINOPHEN 500 MG PO TABS
1000.0000 mg | ORAL_TABLET | Freq: Once | ORAL | Status: AC
  Administered 2022-05-06: 1000 mg via ORAL

## 2022-05-06 MED ORDER — 0.9 % SODIUM CHLORIDE (POUR BTL) OPTIME
TOPICAL | Status: DC | PRN
  Administered 2022-05-06: 1000 mL

## 2022-05-06 MED ORDER — DEXAMETHASONE SODIUM PHOSPHATE 10 MG/ML IJ SOLN
INTRAMUSCULAR | Status: DC | PRN
  Administered 2022-05-06: 5 mg via INTRAVENOUS

## 2022-05-06 MED ORDER — SODIUM CHLORIDE 0.9 % IV SOLN: INTRAVENOUS | Status: DC

## 2022-05-06 MED ORDER — EPHEDRINE SULFATE-NACL 50-0.9 MG/10ML-% IV SOSY
PREFILLED_SYRINGE | INTRAVENOUS | Status: DC | PRN
  Administered 2022-05-06: 10 mg via INTRAVENOUS

## 2022-05-06 MED ORDER — SODIUM CHLORIDE 0.9% FLUSH
3.0000 mL | INTRAVENOUS | Status: DC | PRN
Start: 2022-05-06 — End: 2022-05-06

## 2022-05-06 MED ORDER — PROPOFOL 10 MG/ML IV BOLUS
INTRAVENOUS | Status: DC | PRN
Start: 2022-05-06 — End: 2022-05-06
  Administered 2022-05-06: 200 mg via INTRAVENOUS

## 2022-05-06 MED ORDER — PROPOFOL 10 MG/ML IV BOLUS
INTRAVENOUS | Status: AC
  Filled 2022-05-06: qty 20

## 2022-05-06 MED ORDER — ONDANSETRON HCL 4 MG/2ML IJ SOLN
INTRAMUSCULAR | Status: AC
  Filled 2022-05-06: qty 2

## 2022-05-06 MED ORDER — FENTANYL CITRATE (PF) 100 MCG/2ML IJ SOLN: 25.0000 ug | INTRAMUSCULAR | Status: DC | PRN

## 2022-05-06 MED ORDER — FENTANYL CITRATE (PF) 100 MCG/2ML IJ SOLN
INTRAMUSCULAR | Status: DC | PRN
  Administered 2022-05-06: 50 ug via INTRAVENOUS

## 2022-05-06 MED ORDER — BUPIVACAINE LIPOSOME 1.3 % IJ SUSP
INTRAMUSCULAR | Status: DC | PRN
  Administered 2022-05-06: 20 mL

## 2022-05-06 SURGICAL SUPPLY — 60 items
APL SKNCLS STERI-STRIP NONHPOA (GAUZE/BANDAGES/DRESSINGS) ×2
BENZOIN TINCTURE PRP APPL 2/3 (GAUZE/BANDAGES/DRESSINGS) ×1 IMPLANT
BLADE MINI RND TIP GREEN BEAV (BLADE) ×1 IMPLANT
BLADE SAW LAPIPLASTY 40X11 (BLADE) ×1 IMPLANT
BLADE SURG 15 STRL LF DISP TIS (BLADE) ×16 IMPLANT
BLADE SURG 15 STRL SS (BLADE) ×24
BNDG CMPR 5X3 CHSV STRCH STRL (GAUZE/BANDAGES/DRESSINGS) ×2
BNDG CMPR 9X4 STRL LF SNTH (GAUZE/BANDAGES/DRESSINGS) ×2
BNDG COHESIVE 3X5 TAN ST LF (GAUZE/BANDAGES/DRESSINGS) ×1 IMPLANT
BNDG CONFORM 2 STRL LF (GAUZE/BANDAGES/DRESSINGS) ×3 IMPLANT
BNDG ESMARK 4X9 LF (GAUZE/BANDAGES/DRESSINGS) ×3 IMPLANT
CLOTH BEACON ORANGE TIMEOUT ST (SAFETY) ×3 IMPLANT
COVER BACK TABLE 60X90IN (DRAPES) ×3 IMPLANT
COVER MAYO STAND STRL (DRAPES) ×3 IMPLANT
DRAPE C-ARM 35X43 STRL (DRAPES) ×3 IMPLANT
DRAPE EXTREMITY T 121X128X90 (DISPOSABLE) ×3 IMPLANT
DRAPE SHEET LG 3/4 BI-LAMINATE (DRAPES) ×3 IMPLANT
DURAPREP 26ML APPLICATOR (WOUND CARE) ×3 IMPLANT
ELECT REM PT RETURN 9FT ADLT (ELECTROSURGICAL) ×3
ELECTRODE REM PT RTRN 9FT ADLT (ELECTROSURGICAL) ×2 IMPLANT
GAUZE 4X4 16PLY ~~LOC~~+RFID DBL (SPONGE) ×3 IMPLANT
GAUZE SPONGE 4X4 12PLY STRL (GAUZE/BANDAGES/DRESSINGS) ×3 IMPLANT
GAUZE XEROFORM 1X8 LF (GAUZE/BANDAGES/DRESSINGS) ×3 IMPLANT
GLOVE BIO SURGEON STRL SZ7 (GLOVE) ×1 IMPLANT
GLOVE BIO SURGEON STRL SZ7.5 (GLOVE) ×3 IMPLANT
GLOVE BIOGEL PI IND STRL 7.5 (GLOVE) IMPLANT
GLOVE BIOGEL PI INDICATOR 7.5 (GLOVE) ×1
GLOVE ECLIPSE 6.5 STRL STRAW (GLOVE) ×2 IMPLANT
GOWN STRL REUS W/TWL LRG LVL3 (GOWN DISPOSABLE) ×4 IMPLANT
INST GUIDED SPEEDRELEASE (INSTRUMENTS) ×3
INSTRUMENT GUIDED SPEEDRELEASE (INSTRUMENTS) IMPLANT
KIT TURNOVER CYSTO (KITS) ×3 IMPLANT
LAPIPLASTY SYS 4A (Orthopedic Implant) ×3 IMPLANT
MANIFOLD NEPTUNE II (INSTRUMENTS) ×1 IMPLANT
NDL HYPO 25X1 1.5 SAFETY (NEEDLE) ×2 IMPLANT
NEEDLE HYPO 25X1 1.5 SAFETY (NEEDLE) ×3 IMPLANT
NS IRRIG 500ML POUR BTL (IV SOLUTION) ×3 IMPLANT
PACK BASIN DAY SURGERY FS (CUSTOM PROCEDURE TRAY) ×3 IMPLANT
PADDING CAST ABS 4INX4YD NS (CAST SUPPLIES) ×1
PADDING CAST ABS COTTON 4X4 ST (CAST SUPPLIES) ×2 IMPLANT
PENCIL SMOKE EVACUATOR (MISCELLANEOUS) ×3 IMPLANT
SCREW 2.7 HIGH PITCH LOCKING (Screw) ×2 IMPLANT
SPONGE T-LAP 18X18 ~~LOC~~+RFID (SPONGE) ×6 IMPLANT
SPONGE T-LAP 4X18 ~~LOC~~+RFID (SPONGE) ×6 IMPLANT
STOCKINETTE 4X48 STRL (DRAPES) ×1 IMPLANT
STRIP CLOSURE SKIN 1/2X4 (GAUZE/BANDAGES/DRESSINGS) ×2 IMPLANT
SUCTION FRAZIER HANDLE 10FR (MISCELLANEOUS) ×1
SUCTION TUBE FRAZIER 10FR DISP (MISCELLANEOUS) IMPLANT
SUT ETHILON 4 0 PS 2 18 (SUTURE) ×1 IMPLANT
SUT MNCRL AB 4-0 PS2 18 (SUTURE) ×1 IMPLANT
SUT VIC AB 3-0 PS2 18 (SUTURE) ×6
SUT VIC AB 3-0 PS2 18XBRD (SUTURE) IMPLANT
SUT VIC AB 4-0 PS2 18 (SUTURE) ×9 IMPLANT
SUT VIC AB 5-0 PS2 18 (SUTURE) ×9 IMPLANT
SYR BULB EAR ULCER 3OZ GRN STR (SYRINGE) ×3 IMPLANT
SYR CONTROL 10ML LL (SYRINGE) ×6 IMPLANT
SYSTEM LAPIPLASTY 4A (Orthopedic Implant) IMPLANT
TUBE CONNECTING 12X1/4 (SUCTIONS) IMPLANT
UNDERPAD 30X36 HEAVY ABSORB (UNDERPADS AND DIAPERS) ×3 IMPLANT
WATER STERILE IRR 500ML POUR (IV SOLUTION) ×3 IMPLANT

## 2022-05-06 NOTE — Anesthesia Preprocedure Evaluation (Addendum)
Anesthesia Evaluation  ?Patient identified by MRN, date of birth, ID band ?Patient awake ? ? ? ?Reviewed: ?Allergy & Precautions, NPO status , Patient's Chart, lab work & pertinent test results ? ?History of Anesthesia Complications ?(+) PONV and history of anesthetic complications ? ?Airway ?Mallampati: II ? ?TM Distance: >3 FB ?Neck ROM: Full ? ? ? Dental ?no notable dental hx. ? ?  ?Pulmonary ?neg pulmonary ROS,  ?  ?Pulmonary exam normal ? ? ? ? ? ? ? Cardiovascular ?hypertension, Pt. on medications ?Normal cardiovascular exam ? ? ?  ?Neuro/Psych ?negative neurological ROS ? negative psych ROS  ? GI/Hepatic ?negative GI ROS, Neg liver ROS,   ?Endo/Other  ?diabetes, Oral Hypoglycemic AgentsHypothyroidism  ? Renal/GU ?Renal disease  ? ?  ?Musculoskeletal ?negative musculoskeletal ROS ?(+)  ? Abdominal ?  ?Peds ? Hematology ?negative hematology ROS ?(+)   ?Anesthesia Other Findings ?JOINT DISORDER RIGHT ANKLE AND FOOT ? Reproductive/Obstetrics ? ?  ? ? ? ? ? ? ? ? ? ? ? ? ? ?  ?  ? ? ? ? ? ? ?Anesthesia Physical ?Anesthesia Plan ? ?ASA: 3 ? ?Anesthesia Plan: Regional and General  ? ?Post-op Pain Management: Regional block*  ? ?Induction: Intravenous ? ?PONV Risk Score and Plan: 4 or greater and Ondansetron, Dexamethasone, Midazolam and Treatment may vary due to age or medical condition ? ?Airway Management Planned:  ? ?Additional Equipment:  ? ?Intra-op Plan:  ? ?Post-operative Plan: Extubation in OR ? ?Informed Consent: I have reviewed the patients History and Physical, chart, labs and discussed the procedure including the risks, benefits and alternatives for the proposed anesthesia with the patient or authorized representative who has indicated his/her understanding and acceptance.  ? ? ? ?Dental advisory given ? ?Plan Discussed with: CRNA ? ?Anesthesia Plan Comments:   ? ? ? ? ?Anesthesia Quick Evaluation ? ?

## 2022-05-06 NOTE — Transfer of Care (Signed)
Immediate Anesthesia Transfer of Care Note ? ?Patient: Mariah Russell ? ?Procedure(s) Performed: Procedure(s) (LRB): ?FIRST METATARSAL FIRST CUNEIFORM FUSION WITH LAPOPLASTY RIGHT FOOT (Right) ?TENOTOMY OPEN EXTENSOR FOOT OR SECOND TOE (Right) ? ?Patient Location: PACU ? ?Anesthesia Type: General ? ?Level of Consciousness: awake, sedated, patient cooperative and responds to stimulation ? ?Airway & Oxygen Therapy: Patient Spontanous Breathing and Patient connected to [place] 02  ? ?Post-op Assessment: Report given to PACU RN, Post -op Vital signs reviewed and stable and Patient moving all extremities ? ?Post vital signs: Reviewed and stable ? ?Complications: No apparent anesthesia complications ?

## 2022-05-06 NOTE — Anesthesia Procedure Notes (Signed)
Procedure Name: LMA Insertion ?Date/Time: 05/06/2022 1:23 PM ?Performed by: Francie Massing, CRNA ?Pre-anesthesia Checklist: Patient identified, Emergency Drugs available, Suction available and Patient being monitored ?Patient Re-evaluated:Patient Re-evaluated prior to induction ?Oxygen Delivery Method: Circle system utilized ?Preoxygenation: Pre-oxygenation with 100% oxygen ?Induction Type: IV induction ?Ventilation: Mask ventilation without difficulty ?LMA: LMA inserted ?LMA Size: 4.0 ?Number of attempts: 1 ?Airway Equipment and Method: Bite block ?Placement Confirmation: positive ETCO2 ?Tube secured with: Tape ?Dental Injury: Teeth and Oropharynx as per pre-operative assessment  ? ? ? ? ?

## 2022-05-06 NOTE — Progress Notes (Signed)
Assisted Dr. Ellender with right, popliteal, ultrasound guided block. Side rails up, monitors on throughout procedure. See vital signs in flow sheet. Tolerated Procedure well. 

## 2022-05-06 NOTE — Anesthesia Postprocedure Evaluation (Signed)
Anesthesia Post Note ? ?Patient: Mariah Russell ? ?Procedure(s) Performed: FIRST METATARSAL FIRST CUNEIFORM FUSION WITH LAPOPLASTY RIGHT FOOT (Right: Foot) ?TENOTOMY OPEN EXTENSOR FOOT OR SECOND TOE (Right) ? ?  ? ?Patient location during evaluation: PACU ?Anesthesia Type: General ?Level of consciousness: awake and alert ?Pain management: pain level controlled ?Vital Signs Assessment: post-procedure vital signs reviewed and stable ?Respiratory status: spontaneous breathing, nonlabored ventilation, respiratory function stable and patient connected to nasal cannula oxygen ?Cardiovascular status: blood pressure returned to baseline and stable ?Postop Assessment: no apparent nausea or vomiting ?Anesthetic complications: no ? ? ?No notable events documented. ? ?Last Vitals:  ?Vitals:  ? 05/06/22 1715 05/06/22 1730  ?BP: (!) 144/86 (!) 149/82  ?Pulse: 98 91  ?Resp: 18 12  ?Temp:  36.7 ?C  ?SpO2: 95% 99%  ?  ?Last Pain:  ?Vitals:  ? 05/06/22 1730  ?TempSrc:   ?PainSc: 0-No pain  ? ? ?  ?  ?  ?  ?  ?  ? ?Effie Berkshire ? ? ? ? ?

## 2022-05-06 NOTE — Anesthesia Procedure Notes (Signed)
Anesthesia Regional Block: Popliteal block  ? ?Pre-Anesthetic Checklist: , timeout performed,  Correct Patient, Correct Site, Correct Laterality,  Correct Procedure, Correct Position, site marked,  Risks and benefits discussed,  Surgical consent,  Pre-op evaluation,  At surgeon's request and post-op pain management ? ?Laterality: Right ? ?Prep: chloraprep     ?  ?Needles:  ?Injection technique: Single-shot ? ?Needle Type: Echogenic Stimulator Needle   ? ? ?Needle Length: 10cm  ?Needle Gauge: 20  ? ? ? ?Additional Needles: ? ? ?Procedures:,,,, ultrasound used (permanent image in chart),,    ?Narrative:  ?Start time: 05/06/2022 12:15 PM ?End time: 05/06/2022 12:25 PM ?Injection made incrementally with aspirations every 5 mL. ? ?Performed by: Personally  ?Anesthesiologist: Leonides Grills, MD ? ?Additional Notes: ?Functioning IV was confirmed and monitors were applied.  A timeout was performed. Sterile prep, hand hygiene and sterile gloves were used. A 20ga Bbraun echogenic stimulator needle was used. Negative aspiration and negative test dose prior to incremental administration of local anesthetic. The patient tolerated the procedure well. ? ?Ultrasound guidance: relevent anatomy identified, needle position confirmed, local anesthetic spread visualized around nerve(s), vascular puncture avoided.  Image printed for medical record.  ? ? ? ? ? ?

## 2022-05-06 NOTE — Discharge Instructions (Signed)
?  Post Anesthesia Home Care Instructions ? ?Activity: ?Get plenty of rest for the remainder of the day. A responsible individual must stay with you for 24 hours following the procedure.  ?For the next 24 hours, DO NOT: ?-Drive a car ?-Advertising copywriter ?-Drink alcoholic beverages ?-Take any medication unless instructed by your physician ?-Make any legal decisions or sign important papers. ? ?Meals: ?Start with liquid foods such as gelatin or soup. Progress to regular foods as tolerated. Avoid greasy, spicy, heavy foods. If nausea and/or vomiting occur, drink only clear liquids until the nausea and/or vomiting subsides. Call your physician if vomiting continues. ? ?Special Instructions/Symptoms: ?Your throat may feel dry or sore from the anesthesia or the breathing tube placed in your throat during surgery. If this causes discomfort, gargle with warm salt water. The discomfort should disappear within 24 hours. ? ?Information for Discharge Teaching: ?EXPAREL (bupivacaine liposome injectable suspension)  ? ?Your surgeon or anesthesiologist gave you EXPAREL(bupivacaine) to help control your pain after surgery.  ?EXPAREL is a local anesthetic that provides pain relief by numbing the tissue around the surgical site. ?EXPAREL is designed to release pain medication over time and can control pain for up to 72 hours. ?Depending on how you respond to EXPAREL, you may require less pain medication during your recovery. ? ?Possible side effects: ?Temporary loss of sensation or ability to move in the area where bupivacaine was injected. ?Nausea, vomiting, constipation ?Rarely, numbness and tingling in your mouth or lips, lightheadedness, or anxiety may occur. ?Call your doctor right away if you think you may be experiencing any of these sensations, or if you have other questions regarding possible side effects. ? ?Follow all other discharge instructions given to you by your surgeon or nurse. Eat a healthy diet and drink plenty of  water or other fluids. ? ?If you return to the hospital for any reason within 96 hours following the administration of EXPAREL, it is important for health care providers to know that you have received this anesthetic. A teal colored band has been placed on your arm with the date, time and amount of EXPAREL you have received in order to alert and inform your health care providers. Please leave this armband in place for the full 96 hours following administration, and then you may remove the band.  ? ?Do not remove green armband before Saturday, May 10, 2022. ?    ?

## 2022-05-06 NOTE — Progress Notes (Signed)
I have reviewed the H&P with no changes.  The procedure was reviewed and no guarantees given.   ?She is medically cleared for elective foot surgery. ?

## 2022-05-07 ENCOUNTER — Encounter (HOSPITAL_BASED_OUTPATIENT_CLINIC_OR_DEPARTMENT_OTHER): Payer: Self-pay | Admitting: Podiatry

## 2022-05-17 NOTE — Op Note (Unsigned)
NAMEDURENDA, PECHACEK MEDICAL RECORD NO: [id number] ACCOUNT NO: [id number] DATE OF BIRTH: [birth year] FACILITY: [facility] LOCATION: [facility] PHYSICIAN: Larey Dresser, DPM  Operative Report   SURGEON:  Larey Dresser, DPM  ASSISTANT:  None.  PREOPERATIVE DIAGNOSES: 1.  Right first metatarsal first cuneiform joint disorder with pain. 2.  Contracture second MTP joint, right foot.  POSTOPERATIVE DIAGNOSES: 1.  Right first metatarsal first cuneiform joint disorder with pain. 2.  Contracture second MTP joint, right foot.  PROCEDURE: 1.  Right first metatarsal first cuneiform fusion with internal fixation. 2.  Tenotomy and capsulotomy, second MTP joint, right foot.  ANESTHESIA:  General.  COMPLICATIONS:  None.  DESCRIPTION OF PROCEDURE:  The patient was brought to the OR and placed in the supine position.  A well-padded pneumatic ankle tourniquet was placed above the patient's calf and the foot was scrubbed and prepped in the usual aseptic fashion.  An Esmarch  bandage was utilized to exsanguinate the lower extremity and the tourniquet inflated to 250 mmHg.  Attention was directed to the dorsal aspect of the first metatarsal first cuneiform joint.  Incision was made medial and parallel to the tendon and the EHL  extensor hallucis longus tendon.  The incision was deepened through subcutaneous tissue using sharp and blunt dissection to the level of bone and great care was taken to identify all vital neural and vascular structures.  All bleeders were cauterized as  necessary.  At this time, a saw was used to plane the TMT joint and then the osteotome was used to mobilize the joint, freeing the plantar lateral attachments.  Next, attention was directed to the first interspace where an incision was made.  At this  time, a lateral release was performed by capsulotomy and then using the same ripper tool to release the attachments of the fibular sesamoid.  Upon completion of the lateral release, the  hallux was noted to float in the rectus position.  Next, attention  was directed at the first metatarsal where a pin was placed at the base to be able to rotate and demonstrate reapproximation of the sesamoids, which was confirmed on intraoperative radiographs.  Next, a 3-in-1 cut guide was put at the joint level.  A  small incision was made at the lateral aspect of the second metatarsal 3 mm distal to the cut guide.  Attention was then redirected to the original incision in the usual fashion, the Lapiplasty positioner was placed to reduce the first metatarsal into  ideal position in all 3 planes. Utilizing the cut guide, which were temporarily fixated, the cuts were made at the joint in the usual fashion.  The compressor distractor was then placed and the area was distracted after removing all the pin fixation.   The bone chips were then removed and great care was to ensure no remnants remained.  Then, the area was irrigated.  Next, the area was then overdrilled to allow for medullary bleeding.  The compressor was then used to bring the two bones of the joint  together.  Once position was confirmed both with dorsal and lateral images, two points of temporary fixation was used, 1 threaded olive wire and one 0.062 K-wire.  Next, 2 Lapiplasty 4-hole plates were then placed, one dorsal and one medial. Two holes of  each plate were distal and 2 were proximal.  These were fixated with screws utilizing fluoroscopy.  Fixation was found to be excellent and there was ideal alignment of the first ray in relation to all 3  planes.  Once proper fixation was confirmed, the  temporary wires were then removed and the area was irrigated with copious amounts of sterile saline.  Deep closure was accomplished with 3-0 and 4-0 Vicryl.  Skin closure was accomplished with 4-0 Monocryl on the long incision and the other two incisions  with 4-0 nylon.  Next, a second procedure was performed and attention was directed to the second  MTP joint where a stab incision was made.  Incision was deepened to the level of the dorsal capsule where the extensor tendon was released as well as its  dorsal capsule.  This was found to put the second MTP joint in a more relaxed position.  The area was irrigated and skin closure was accomplished with 4-0 nylon.  The foot was dressed with Steri-Strips and before the bandage was applied, Exparel was  injected for pain management.  The foot was then dressed with Xeroform, 4 x 4's, Kling and Coban.  The tourniquet was deflated at 1 hour and 59 minutes during the case.  Vascular status was found to be good.  No guarantees given.  All questions answered.   NIK D: 05/17/2022 4:05:02 pm T: 05/17/2022 11:34:00 pm  JOB: [id number]/ [id number]

## 2023-03-08 ENCOUNTER — Emergency Department (HOSPITAL_BASED_OUTPATIENT_CLINIC_OR_DEPARTMENT_OTHER)
Admission: EM | Admit: 2023-03-08 | Discharge: 2023-03-08 | Disposition: A | Discharge status: Home / Self Care | Payer: Medicare Other | Attending: Emergency Medicine | Admitting: Emergency Medicine

## 2023-03-08 ENCOUNTER — Other Ambulatory Visit: Payer: Self-pay

## 2023-03-08 ENCOUNTER — Emergency Department (HOSPITAL_BASED_OUTPATIENT_CLINIC_OR_DEPARTMENT_OTHER): Payer: Medicare Other

## 2023-03-08 DIAGNOSIS — Z7984 Long term (current) use of oral hypoglycemic drugs: Secondary | ICD-10-CM | POA: Diagnosis not present

## 2023-03-08 DIAGNOSIS — E119 Type 2 diabetes mellitus without complications: Secondary | ICD-10-CM | POA: Insufficient documentation

## 2023-03-08 DIAGNOSIS — Z79899 Other long term (current) drug therapy: Secondary | ICD-10-CM | POA: Diagnosis not present

## 2023-03-08 DIAGNOSIS — I10 Essential (primary) hypertension: Secondary | ICD-10-CM | POA: Insufficient documentation

## 2023-03-08 DIAGNOSIS — R0789 Other chest pain: Secondary | ICD-10-CM | POA: Diagnosis present

## 2023-03-08 DIAGNOSIS — R000 Tachycardia, unspecified: Secondary | ICD-10-CM | POA: Diagnosis not present

## 2023-03-08 LAB — BASIC METABOLIC PANEL
Anion gap: 7 (ref 5–15)
BUN: 16 mg/dL (ref 8–23)
CO2: 22 mmol/L (ref 22–32)
Calcium: 8.8 mg/dL — ABNORMAL LOW (ref 8.9–10.3)
Chloride: 100 mmol/L (ref 98–111)
Creatinine, Ser: 1.27 mg/dL — ABNORMAL HIGH (ref 0.44–1.00)
GFR, Estimated: 47 mL/min — ABNORMAL LOW (ref >60)
Glucose, Bld: 383 mg/dL — ABNORMAL HIGH (ref 70–99)
Potassium: 4.4 mmol/L (ref 3.5–5.1)
Sodium: 129 mmol/L — ABNORMAL LOW (ref 135–145)

## 2023-03-08 LAB — CBC WITH DIFFERENTIAL/PLATELET
Abs Immature Granulocytes: 0.02 10*3/uL (ref 0.00–0.07)
Basophils Absolute: 0.1 10*3/uL (ref 0.0–0.1)
Basophils Relative: 1 %
Eosinophils Absolute: 0.3 10*3/uL (ref 0.0–0.5)
Eosinophils Relative: 4 %
HCT: 41.8 % (ref 36.0–46.0)
Hemoglobin: 14.4 g/dL (ref 12.0–15.0)
Immature Granulocytes: 0 %
Lymphocytes Relative: 36 %
Lymphs Abs: 2.4 10*3/uL (ref 0.7–4.0)
MCH: 32.5 pg (ref 26.0–34.0)
MCHC: 34.4 g/dL (ref 30.0–36.0)
MCV: 94.4 fL (ref 80.0–100.0)
Monocytes Absolute: 0.5 10*3/uL (ref 0.1–1.0)
Monocytes Relative: 7 %
Neutro Abs: 3.4 10*3/uL (ref 1.7–7.7)
Neutrophils Relative %: 52 %
Platelets: 285 10*3/uL (ref 150–400)
RBC: 4.43 MIL/uL (ref 3.87–5.11)
RDW: 11.3 % — ABNORMAL LOW (ref 11.5–15.5)
WBC: 6.5 10*3/uL (ref 4.0–10.5)
nRBC: 0 % (ref 0.0–0.2)

## 2023-03-08 LAB — TROPONIN I (HIGH SENSITIVITY): Troponin I (High Sensitivity): 8 ng/L (ref <18)

## 2023-03-08 LAB — D-DIMER, QUANTITATIVE: D-Dimer, Quant: 0.28 ug/mL-FEU (ref 0.00–0.50)

## 2023-03-08 MED ORDER — KETOROLAC TROMETHAMINE 15 MG/ML IJ SOLN
15.0000 mg | Freq: Once | INTRAMUSCULAR | Status: AC
  Administered 2023-03-08: 15 mg via INTRAVENOUS
  Filled 2023-03-08: qty 1

## 2023-03-08 MED ORDER — METHOCARBAMOL 500 MG PO TABS
500.0000 mg | ORAL_TABLET | Freq: Once | ORAL | Status: AC
  Administered 2023-03-08: 500 mg via ORAL
  Filled 2023-03-08: qty 1

## 2023-03-08 MED ORDER — HYDROMORPHONE HCL 1 MG/ML IJ SOLN
0.5000 mg | Freq: Once | INTRAMUSCULAR | Status: AC
  Administered 2023-03-08: 0.5 mg via INTRAVENOUS
  Filled 2023-03-08: qty 1

## 2023-03-08 MED ORDER — METHOCARBAMOL 500 MG PO TABS: 500.0000 mg | ORAL_TABLET | Freq: Two times a day (BID) | ORAL | 0 refills | Status: AC | PRN

## 2023-03-08 MED ORDER — SODIUM CHLORIDE 0.9 % IV BOLUS
1000.0000 mL | Freq: Once | INTRAVENOUS | Status: AC
  Administered 2023-03-08: 1000 mL via INTRAVENOUS

## 2023-03-08 NOTE — Discharge Instructions (Addendum)
You are being prescribed a muscle relaxer which can help with your symptoms but might also make you feel dizzy, lightheaded, sleepy, be off balance, or cause other side effects.  Do not combine this with other medications or alcohol.  Do not drive or operate heavy machinery.  You may still take Tylenol and ibuprofen to help with pain.  If you develop recurrent, continued, or worsening chest pain, shortness of breath, fever, vomiting, abdominal or back pain, or any other new/concerning symptoms then return to the ER for evaluation.

## 2023-03-08 NOTE — ED Notes (Signed)
Pt. Had a 20 g in the L ac that was placed on admission by Charge and was d/cd  at discharge by RN Xxavier Noon.  Pt. Had all labs and meds and fluids thru this IV.

## 2023-03-08 NOTE — ED Notes (Signed)
Family updated as to patient's status.

## 2023-03-08 NOTE — ED Provider Notes (Signed)
[facility] Provider Note   CSN: [id number] Arrival date & time: 03/08/23  1207     History  Chief Complaint  Patient presents with   Chest Pain    R side chest pain thru to her back and down the R arm with tingling into the R fingers.    Mariah Russell is a 67 y.o. female.  HPI 67 year old female with a history of hypertension and diabetes presents with right-sided chest pain.  Symptoms originally started on 3/13.  Patient wondered if it was related to her Pilates from a couple days before.  She is felt like it is likely due to spasms or other muscle strain.  However Tylenol and NSAIDs have not helped.  She went and got a massage but it did not seem to help.  However she was told that her muscles seemed "tight".  The pain causes her to be short of breath.  Certain movements and walking as well as inspiration worsen the pain.  The pain also causes some tingling going from her right chest up to her right thoracic back.  Symptoms seem to wrap around rather than knife straight through.  No abdominal pain, vomiting, cough.  Pain is currently severe.  Home Medications Prior to Admission medications   Medication Sig Start Date End Date Taking? Authorizing Provider  methocarbamol (ROBAXIN) 500 MG tablet Take 1 tablet (500 mg total) by mouth 2 (two) times daily as needed for muscle spasms. 03/08/23  Yes Sherwood Gambler, MD  atorvastatin (LIPITOR) 10 MG tablet Take 10 mg by mouth at bedtime.    [provider]  Cholecalciferol (VITAMIN D3) 50 MCG (2000 UT) TABS Take 1 tablet by mouth at bedtime.    [provider]  Cyanocobalamin (B-12) 1000 MCG CAPS Take 1 capsule by mouth daily.    [provider]  Liothyronine Sodium (CYTOMEL PO) Take 15 mcg by mouth daily.    [provider]  lisinopril (ZESTRIL) 5 MG tablet Take 5 mg by mouth at bedtime.    [provider]  metFORMIN (GLUCOPHAGE) 500 MG tablet Take  500 mg by mouth at bedtime.    [provider]  Probiotic Product (CULTURELLE PROBIOTICS PO) Take 1 capsule by mouth at bedtime. Brand-- Bluesky    [provider]  progesterone (PROMETRIUM) 200 MG capsule Take 200 mg by mouth at bedtime.    [provider]  zinc gluconate 50 MG tablet Take 50 mg by mouth at bedtime.    [provider]      Allergies    Patient has no known allergies.    Review of Systems   Review of Systems  Constitutional:  Negative for fever.  Respiratory:  Positive for shortness of breath. Negative for cough.   Cardiovascular:  Positive for chest pain. Negative for leg swelling.  Gastrointestinal:  Negative for abdominal pain and vomiting.  Musculoskeletal:  Positive for back pain.    Physical Exam Updated Vital Signs BP (!) 178/99   Pulse 70   Temp 97.7 F (36.5 C)   Resp 13   Ht 5' 9.5" (1.765 m)   Wt 74.8 kg   SpO2 100%   BMI 24.02 kg/m  Physical Exam Vitals and nursing note reviewed.  Constitutional:      Appearance: She is well-developed.  HENT:     Head: Normocephalic and atraumatic.  Cardiovascular:     Rate and Rhythm: Regular rhythm. Tachycardia present.     Pulses:  Radial pulses are 2+ on the right side.     Heart sounds: Normal heart sounds.     Comments: HR~100 Pulmonary:     Effort: Pulmonary effort is normal.     Breath sounds: Normal breath sounds.  Abdominal:     Palpations: Abdomen is soft.     Tenderness: There is no abdominal tenderness.  Musculoskeletal:       Back:     Right lower leg: No edema.     Left lower leg: No edema.  Skin:    General: Skin is warm and dry.  Neurological:     Mental Status: She is alert.     ED Results / Procedures / Treatments   Labs (all labs ordered are listed, but only abnormal results are displayed) Labs Reviewed  BASIC METABOLIC PANEL - Abnormal; Notable for the following components:      Result Value   Sodium 129 (*)    Glucose, Bld  383 (*)    Creatinine, Ser 1.27 (*)    Calcium 8.8 (*)    GFR, Estimated 47 (*)    All other components within normal limits  CBC WITH DIFFERENTIAL/PLATELET - Abnormal; Notable for the following components:   RDW 11.3 (*)    All other components within normal limits  D-DIMER, QUANTITATIVE  TROPONIN I (HIGH SENSITIVITY)    EKG EKG Interpretation  Date/Time:  Sunday March 08 2023 12:20:42 EDT Ventricular Rate:  102 PR Interval:  150 QRS Duration: 84 QT Interval:  337 QTC Calculation: 439 R Axis:   78 Text Interpretation: Sinus tachycardia Atrial premature complex rate is faster, otherwise no significant change since 2021 Confirmed by Sherwood Gambler [phone]) on 03/08/2023 12:24:14 PM  Radiology DG Chest Portable 1 View  Result Date: 03/08/2023 CLINICAL DATA:  Shortness of breath. EXAM: PORTABLE CHEST 1 VIEW COMPARISON:  10/20/2022 FINDINGS: The lungs are clear without focal pneumonia, edema, pneumothorax or pleural effusion. The cardiopericardial silhouette is within normal limits for size. The visualized bony structures of the thorax are unremarkable. Telemetry leads overlie the chest. IMPRESSION: No active disease. Electronically Signed   By: Misty Stanley M.D.   On: 03/08/2023 12:45    Procedures Procedures    Medications Ordered in ED Medications  sodium chloride 0.9 % bolus 1,000 mL (0 mLs Intravenous Stopped 03/08/23 1421)  HYDROmorphone (DILAUDID) injection 0.5 mg (0.5 mg Intravenous Given 03/08/23 1303)  ketorolac (TORADOL) 15 MG/ML injection 15 mg (15 mg Intravenous Given 03/08/23 1428)  methocarbamol (ROBAXIN) tablet 500 mg (500 mg Oral Given 03/08/23 1427)    ED Course/ Medical Decision Making/ A&P                             Medical Decision Making Amount and/or Complexity of Data Reviewed Labs: ordered.    Details: Troponin normal.  D-dimer normal. Radiology: ordered and independent interpretation performed.    Details: No CHF or pneumonia. ECG/medicine tests:  ordered and independent interpretation performed.    Details: No acute ischemia.   Risk Prescription drug management.   Patient's pain is likely muscular. There seems to be a spasm component in her thoracic back on exam. I did not palpate her chest, but she reports it is tender to palpation. I think PE is unlikely.  She has been having continuous chest pain for multiple days and with an unlikely history for ACS despite her comorbidities, a single troponin seems adequate to rule out MI.  She  was given Dilaudid with some good relief.  Still has some residual pain.  Will give a dose of Toradol and started her on Robaxin as I suspect there is a muscular spasm component more than anything else.  Otherwise she appears stable for discharge.  I have a low suspicion of ACS, PE, dissection.  Will give return precautions.        Final Clinical Impression(s) / ED Diagnoses Final diagnoses:  Chest wall pain    Rx / DC Orders ED Discharge Orders          Ordered    methocarbamol (ROBAXIN) 500 MG tablet  2 times daily PRN        03/08/23 1410              Sherwood Gambler, MD 03/08/23 1524

## 2023-03-08 NOTE — ED Notes (Signed)
IV site clean dry on discharge and no issues.

## 2023-03-08 NOTE — ED Triage Notes (Signed)
Pt. Reports this pain has been going on since Wednesday and she has been having this pain off and on off and on and she has had shob with the pain.  The pain goes down the R arm from the R side of her chest.

## 2023-03-10 ENCOUNTER — Other Ambulatory Visit: Payer: Self-pay

## 2023-03-10 ENCOUNTER — Emergency Department (HOSPITAL_BASED_OUTPATIENT_CLINIC_OR_DEPARTMENT_OTHER): Payer: Medicare Other

## 2023-03-10 ENCOUNTER — Emergency Department (HOSPITAL_BASED_OUTPATIENT_CLINIC_OR_DEPARTMENT_OTHER)
Admission: EM | Admit: 2023-03-10 | Discharge: 2023-03-10 | Disposition: A | Discharge status: Home / Self Care | Payer: Medicare Other | Attending: Emergency Medicine | Admitting: Emergency Medicine

## 2023-03-10 ENCOUNTER — Encounter (HOSPITAL_BASED_OUTPATIENT_CLINIC_OR_DEPARTMENT_OTHER): Payer: Self-pay | Admitting: Emergency Medicine

## 2023-03-10 DIAGNOSIS — N189 Chronic kidney disease, unspecified: Secondary | ICD-10-CM | POA: Diagnosis not present

## 2023-03-10 DIAGNOSIS — I129 Hypertensive chronic kidney disease with stage 1 through stage 4 chronic kidney disease, or unspecified chronic kidney disease: Secondary | ICD-10-CM | POA: Insufficient documentation

## 2023-03-10 DIAGNOSIS — R0602 Shortness of breath: Secondary | ICD-10-CM | POA: Insufficient documentation

## 2023-03-10 DIAGNOSIS — R079 Chest pain, unspecified: Secondary | ICD-10-CM

## 2023-03-10 DIAGNOSIS — M25511 Pain in right shoulder: Secondary | ICD-10-CM | POA: Diagnosis not present

## 2023-03-10 LAB — CBC WITH DIFFERENTIAL/PLATELET
Abs Immature Granulocytes: 0.03 10*3/uL (ref 0.00–0.07)
Basophils Absolute: 0.1 10*3/uL (ref 0.0–0.1)
Basophils Relative: 1 %
Eosinophils Absolute: 0.2 10*3/uL (ref 0.0–0.5)
Eosinophils Relative: 3 %
HCT: 41.3 % (ref 36.0–46.0)
Hemoglobin: 13.9 g/dL (ref 12.0–15.0)
Immature Granulocytes: 0 %
Lymphocytes Relative: 18 %
Lymphs Abs: 1.4 10*3/uL (ref 0.7–4.0)
MCH: 32.2 pg (ref 26.0–34.0)
MCHC: 33.7 g/dL (ref 30.0–36.0)
MCV: 95.6 fL (ref 80.0–100.0)
Monocytes Absolute: 0.4 10*3/uL (ref 0.1–1.0)
Monocytes Relative: 6 %
Neutro Abs: 5.3 10*3/uL (ref 1.7–7.7)
Neutrophils Relative %: 72 %
Platelets: 256 10*3/uL (ref 150–400)
RBC: 4.32 MIL/uL (ref 3.87–5.11)
RDW: 11.3 % — ABNORMAL LOW (ref 11.5–15.5)
WBC: 7.4 10*3/uL (ref 4.0–10.5)
nRBC: 0 % (ref 0.0–0.2)

## 2023-03-10 LAB — BASIC METABOLIC PANEL
Anion gap: 7 (ref 5–15)
BUN: 18 mg/dL (ref 8–23)
CO2: 23 mmol/L (ref 22–32)
Calcium: 8.5 mg/dL — ABNORMAL LOW (ref 8.9–10.3)
Chloride: 98 mmol/L (ref 98–111)
Creatinine, Ser: 1.2 mg/dL — ABNORMAL HIGH (ref 0.44–1.00)
GFR, Estimated: 50 mL/min — ABNORMAL LOW (ref >60)
Glucose, Bld: 300 mg/dL — ABNORMAL HIGH (ref 70–99)
Potassium: 3.7 mmol/L (ref 3.5–5.1)
Sodium: 128 mmol/L — ABNORMAL LOW (ref 135–145)

## 2023-03-10 LAB — HEPATIC FUNCTION PANEL
ALT: 22 U/L (ref 0–44)
AST: 21 U/L (ref 15–41)
Albumin: 4 g/dL (ref 3.5–5.0)
Alkaline Phosphatase: 75 U/L (ref 38–126)
Bilirubin, Direct: 0.2 mg/dL (ref 0.0–0.2)
Indirect Bilirubin: 0.8 mg/dL (ref 0.3–0.9)
Total Bilirubin: 1 mg/dL (ref 0.3–1.2)
Total Protein: 7.6 g/dL (ref 6.5–8.1)

## 2023-03-10 LAB — TROPONIN I (HIGH SENSITIVITY): Troponin I (High Sensitivity): 8 ng/L (ref <18)

## 2023-03-10 MED ORDER — NAPROXEN 500 MG PO TABS: 500.0000 mg | ORAL_TABLET | Freq: Two times a day (BID) | ORAL | 0 refills | Status: AC

## 2023-03-10 MED ORDER — IOHEXOL 350 MG/ML SOLN
100.0000 mL | Freq: Once | INTRAVENOUS | Status: AC | PRN
  Administered 2023-03-10: 100 mL via INTRAVENOUS

## 2023-03-10 MED ORDER — KETOROLAC TROMETHAMINE 15 MG/ML IJ SOLN
15.0000 mg | Freq: Once | INTRAMUSCULAR | Status: AC
  Administered 2023-03-10: 15 mg via INTRAVENOUS
  Filled 2023-03-10: qty 1

## 2023-03-10 MED ORDER — ACETAMINOPHEN 500 MG PO TABS
1000.0000 mg | ORAL_TABLET | Freq: Once | ORAL | Status: AC
  Administered 2023-03-10: 1000 mg via ORAL
  Filled 2023-03-10: qty 2

## 2023-03-10 MED ORDER — HYDRALAZINE HCL 25 MG PO TABS: 25.0000 mg | ORAL_TABLET | Freq: Three times a day (TID) | ORAL | 0 refills | Status: AC

## 2023-03-10 MED ORDER — MORPHINE SULFATE (PF) 4 MG/ML IV SOLN
4.0000 mg | Freq: Once | INTRAVENOUS | Status: AC
  Administered 2023-03-10: 4 mg via INTRAVENOUS
  Filled 2023-03-10: qty 1

## 2023-03-10 MED ORDER — ONDANSETRON HCL 4 MG/2ML IJ SOLN
4.0000 mg | Freq: Once | INTRAMUSCULAR | Status: AC
  Administered 2023-03-10: 4 mg via INTRAVENOUS
  Filled 2023-03-10: qty 2

## 2023-03-10 MED ORDER — HYDRALAZINE HCL 25 MG PO TABS
25.0000 mg | ORAL_TABLET | Freq: Once | ORAL | Status: AC
  Administered 2023-03-10: 25 mg via ORAL
  Filled 2023-03-10: qty 1

## 2023-03-10 MED ORDER — ACETAMINOPHEN 500 MG PO TABS: 1000.0000 mg | ORAL_TABLET | Freq: Four times a day (QID) | ORAL | 0 refills | Status: AC | PRN

## 2023-03-10 NOTE — ED Provider Notes (Signed)
[facility] Provider Note   CSN: [id number] Arrival date & time: 03/10/23  1223     History Chief Complaint  Patient presents with   Chest Pain    HPI Mariah Russell is a 67 y.o. female presenting for chief complaint of chest pain.  67 year old female with a history of hypertension and obesity.  States that she has had a right shoulder pain since last week.  It has been intermittent in nature.  Second visit for similar.  Denies fevers chills, nausea vomiting, single shortness of breath.  Does endorse some nausea today.  Otherwise ambulatory tolerating p.o. intake.  [place] not have a local primary care provider requesting referral to neurology as she feels that its neuropathic in nature but states that it does radiate into her chest.   Patient's recorded medical, surgical, social, medication list and allergies were reviewed in the Snapshot window as part of the initial history.   Review of Systems   Review of Systems  Constitutional:  Negative for chills and fever.  HENT:  Negative for ear pain and sore throat.   Eyes:  Negative for pain and visual disturbance.  Respiratory:  Negative for cough and shortness of breath.   Cardiovascular:  Positive for chest pain. Negative for palpitations.  Gastrointestinal:  Negative for abdominal pain and vomiting.  Genitourinary:  Negative for dysuria and hematuria.  Musculoskeletal:  Negative for arthralgias and back pain.  Skin:  Negative for color change and rash.  Neurological:  Negative for seizures and syncope.  All other systems reviewed and are negative.   Physical Exam Updated Vital Signs BP (!) 190/86   Pulse 74   Temp 97.7 F (36.5 C)   Resp 16   Wt 75 kg   SpO2 99%   BMI 24.07 kg/m  Physical Exam Vitals and nursing note reviewed.  Constitutional:      General: She is not in acute distress.    Appearance: She is well-developed.  HENT:      Head: Normocephalic and atraumatic.  Eyes:     Conjunctiva/sclera: Conjunctivae normal.  Cardiovascular:     Rate and Rhythm: Normal rate and regular rhythm.     Heart sounds: No murmur heard. Pulmonary:     Effort: Pulmonary effort is normal. No respiratory distress.     Breath sounds: Normal breath sounds.  Abdominal:     General: There is no distension.     Palpations: Abdomen is soft.     Tenderness: There is no abdominal tenderness. There is no right CVA tenderness or left CVA tenderness.  Musculoskeletal:        General: No swelling or tenderness. Normal range of motion.     Cervical back: Neck supple.  Skin:    General: Skin is warm and dry.  Neurological:     General: No focal deficit present.     Mental Status: She is alert and oriented to person, place, and time. Mental status is at baseline.     Cranial Nerves: No cranial nerve deficit.      ED Course/ Medical Decision Making/ A&P    Procedures Procedures   Medications Ordered in ED Medications  ketorolac (TORADOL) 15 MG/ML injection 15 mg (has no administration in time range)  ondansetron (ZOFRAN) injection 4 mg (4 mg Intravenous Given 03/10/23 1255)  acetaminophen (TYLENOL) tablet 1,000 mg (1,000 mg Oral Given 03/10/23 1255)  morphine (PF) 4 MG/ML injection  4 mg (4 mg Intravenous Given 03/10/23 1255)  iohexol (OMNIPAQUE) 350 MG/ML injection 100 mL (100 mLs Intravenous Contrast Given 03/10/23 1421)  hydrALAZINE (APRESOLINE) tablet 25 mg (25 mg Oral Given 03/10/23 1510)   Medical Decision Making: Mariah Russell is a 67 y.o. female who presented to the ED today with chest pain, detailed above.  Based on patient's comorbidities, patient has a heart score of 3.    Patient's presentation is complicated by their history of multiple comorbid medical problems .  Patient placed on continuous vitals and telemetry monitoring while in ED which was reviewed periodically.  Complete initial physical exam performed, notably the  patient was hemodynamically stable in no acute distress.   Reviewed and confirmed nursing documentation for past medical history, family history, social history.    Initial Assessment: With the patient's presentation of left-sided chest pain, most likely diagnosis is musculoskeletal chest pain versus GERD, although ACS remains on the differential. Other diagnoses were considered including (but not limited to) pulmonary embolism, community-acquired pneumonia, aortic dissection, pneumothorax, underlying bony abnormality, anemia. These are considered less likely due to history of present illness and physical exam findings.    In particular, concerning pulmonary embolism: Patient has remained symptomatic despite low risk Wells symptoms 4 days ago Aortic Dissection also reconsidered but seems less likely based on the location, quality, onset, and severity of symptoms in this case. Patient has a lack of serious comorbidities for this condition including a lack of Smoking. Patient also has a lack of underlying history of AD or TAA.  This is most consistent with an acute life/limb threatening illness complicated by underlying chronic conditions.   Initial Plan: Evaluate for ACS with single troponin given duration of symptoms and negative troponin 2 days ago and EKG evaluated as below  Evaluate for dissection, bony abnormality, or pneumonia with chest x-ray and screening laboratory evaluation including CBC, BMP  Further evaluation for pulmonary embolism indicated at this time based on patient's PERC and Wells score.  Will proceed with CTA PE Further evaluation for Thoracic Aortic Dissection not indicated at this time based on patient's clinical history and PE findings.   Initial Study Results: EKG was reviewed independently. Rate, rhythm, axis, intervals all examined and without medically relevant abnormality. ST segments without concerns for elevations.    Laboratory  Single troponin demonstratedNAA    CBC and BMP without obvious metabolic or inflammatory abnormalities requiring further evaluation   Radiology  CT Angio Chest Pulmonary Embolism (PE) W or WO Contrast  Result Date: 03/10/2023 CLINICAL DATA:  Recurrent right chest pain, shortness of breath, high clinical suspicion for PE EXAM: CT ANGIOGRAPHY CHEST WITH CONTRAST TECHNIQUE: Multidetector CT imaging of the chest was performed using the standard protocol during bolus administration of intravenous contrast. Multiplanar CT image reconstructions and MIPs were obtained to evaluate the vascular anatomy. RADIATION DOSE REDUCTION: This exam was performed according to the departmental dose-optimization program which includes automated exposure control, adjustment of the mA and/or kV according to patient size and/or use of iterative reconstruction technique. CONTRAST:  174mL OMNIPAQUE IOHEXOL 350 MG/ML SOLN COMPARISON:  Previous studies including chest radiographs done on 03/08/2023 FINDINGS: Cardiovascular: There are no intraluminal filling defects in pulmonary artery branches. There is homogeneous enhancement in thoracic aorta. Mediastinum/Nodes: No significant lymphadenopathy is seen. Lungs/Pleura: There is no focal pulmonary consolidation. Small linear densities in the apices may suggest scarring. Subtle crowding of markings in the posterior lower lung fields may suggest passive congestive change are scarring or interstitial pneumonia. Minimal  right pleural effusion is seen. There is no pneumothorax. Upper Abdomen: No acute findings are seen. Musculoskeletal: Degenerative changes are noted in lower cervical spine and thoracic spine. Schmorl's node is seen in the upper endplate of body of T3 vertebra. There is sclerotic density in the anterior body of T10 vertebra, possibly a benign bone island. Review of the MIP images confirms the above findings. IMPRESSION: There is no evidence of pulmonary artery embolism. There is no evidence of thoracic aortic  dissection. There is no focal pulmonary consolidation. Subtle increased interstitial markings are seen in the posterior lower lung fields suggesting scarring or mild interstitial pneumonia. Minimal right pleural effusion is seen. Electronically Signed   By: Elmer Picker M.D.   On: 03/10/2023 14:43   US Abdomen Limited RUQ (LIVER/GB)  Result Date: 03/10/2023 CLINICAL DATA:  Right upper quadrant pain EXAM: ULTRASOUND ABDOMEN LIMITED RIGHT UPPER QUADRANT COMPARISON:  None Available. FINDINGS: Gallbladder: No gallstones or wall thickening visualized. No sonographic Murphy sign noted by sonographer. Common bile duct: Diameter: 1.8 mm Liver: No focal lesion identified. Increased parenchymal echogenicity. Portal vein is patent on color Doppler imaging with normal direction of blood flow towards the liver. Other: None. IMPRESSION: 1. No cholelithiasis or sonographic evidence of acute cholecystitis. 2. Hepatic steatosis. Electronically Signed   By: Yetta Glassman M.D.   On: 03/10/2023 13:37   DG Chest Portable 1 View  Result Date: 03/08/2023 CLINICAL DATA:  Shortness of breath. EXAM: PORTABLE CHEST 1 VIEW COMPARISON:  10/20/2022 FINDINGS: The lungs are clear without focal pneumonia, edema, pneumothorax or pleural effusion. The cardiopericardial silhouette is within normal limits for size. The visualized bony structures of the thorax are unremarkable. Telemetry leads overlie the chest. IMPRESSION: No active disease. Electronically Signed   By: Misty Stanley M.D.   On: 03/08/2023 12:45    Final Assessment and Plan: Patient's objective findings do not reveal any acute pathology.  Symptoms are resolved with supportive care.  Had a prolonged conversation with patient regarding accepted management.  She would like to proceed with NSAID therapy given her mild CKD, low-dose NSAID therapy is reasonable in the setting of Tylenol and her home oxycodone utilization.  I we discussed risk of progressive kidney disease  and patient states she will follow-up with her primary care doctor for repeat lab testing. Additionally she requested referral to neurology due to this shooting pain in the right shoulder.  This is reasonable as well as it may be neuropathic in origin and would warrant outpatient care management.  No acute life-threatening pathology identified today with extensive evaluation and given reassuring objective findings otherwise, patient stable for outpatient care and management. Disposition:  I have considered need for hospitalization, however, considering all of the above, I believe this patient is stable for discharge at this time.  Patient/family educated about specific return precautions for given chief complaint and symptoms.  Patient/family educated about follow-up with PCP/Neurology.     Patient/family expressed understanding of return precautions and need for follow-up. Patient spoken to regarding all imaging and laboratory results and appropriate follow up for these results. All education provided in verbal form with additional information in written form. Time was allowed for answering of patient questions. Patient discharged.    Emergency Department Medication Summary:   Medications  ketorolac (TORADOL) 15 MG/ML injection 15 mg (has no administration in time range)  ondansetron (ZOFRAN) injection 4 mg (4 mg Intravenous Given 03/10/23 1255)  acetaminophen (TYLENOL) tablet 1,000 mg (1,000 mg Oral Given 03/10/23 1255)  morphine (PF) 4 MG/ML injection 4 mg (4 mg Intravenous Given 03/10/23 1255)  iohexol (OMNIPAQUE) 350 MG/ML injection 100 mL (100 mLs Intravenous Contrast Given 03/10/23 1421)  hydrALAZINE (APRESOLINE) tablet 25 mg (25 mg Oral Given 03/10/23 1510)      Clinical Impression:  1. Chest pain, unspecified type      Discharge   Final Clinical Impression(s) / ED Diagnoses Final diagnoses:  Chest pain, unspecified type    Rx / DC Orders ED Discharge Orders          Ordered     Ambulatory referral to Neurology       Comments: An appointment is requested in approximately: 1 week   03/10/23 1459    Ambulatory referral to Neurology        03/10/23 1459    naproxen (NAPROSYN) 500 MG tablet  2 times daily        03/10/23 1504    acetaminophen (TYLENOL) 500 MG tablet  Every 6 hours PRN        03/10/23 1504    hydrALAZINE (APRESOLINE) 25 MG tablet  3 times daily        03/10/23 1504              Tretha Sciara, MD 03/10/23 1514

## 2023-03-10 NOTE — ED Triage Notes (Signed)
Right upper recurrent chest pain , high BP today . Reports shortness of breath worse with supine . No cough . Nauseated today yet no emesis

## 2023-03-10 NOTE — ED Notes (Signed)
Discharge paperwork reviewed entirely with patient, including Rx's and follow up care. Pain was under control. Pt verbalized understanding as well as all parties involved. No questions or concerns voiced at the time of discharge. No acute distress noted.   Pt ambulated out to PVA without incident or assistance.  

## 2023-04-21 ENCOUNTER — Ambulatory Visit: Payer: Medicare Other | Admitting: Neurology

## 2023-04-21 ENCOUNTER — Encounter: Payer: Self-pay | Admitting: Neurology

## 2023-04-21 ENCOUNTER — Telehealth: Payer: Self-pay | Admitting: Neurology

## 2023-04-21 VITALS — BP 142/90 | HR 87 | Ht 69.0 in | Wt 167.2 lb

## 2023-04-21 DIAGNOSIS — M542 Cervicalgia: Secondary | ICD-10-CM | POA: Diagnosis not present

## 2023-04-21 NOTE — Progress Notes (Signed)
Chief Complaint  Patient presents with   Hospitalization Follow-up    Rm 17, ALONE possible neuropathic shoulder pain (R SIDE)      ASSESSMENT AND PLAN  Mariah Russell is a 67 y.o. female   Acute onset of right neck pain, radiating pain to right shoulder,  Most worrisome for right cervical radiculopathy  MRI of cervical spine  EMG nerve conduction study  DIAGNOSTIC DATA (LABS, IMAGING, TESTING) - I reviewed patient records, labs, notes, testing and imaging myself where available.   MEDICAL HISTORY:  Mariah Russell, is a 67 year old female seen in request by her primary care physician Dr. Doran Durand, Almeta Monas, for evaluation of right neck pain, initial evaluation was on April 21, 2023    I reviewed and summarized the referring note. 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, 2023, noticed right neck pain, radiating pain towards right shoulder, surrounding her right shoulder socket, it was so severe, burning sensation, as if she pulled a muscle, no rash broke out, difficulty sleeping, eventually went to emergency room a week later on March 10, 2023, was given NSAIDs, and then referred to neurology clinic  Over the past few weeks, her shoulder pain has much improved, has intermittent numbness tingling, trouble along her right arm, sometimes she feels her right hand curled up, have to straighten out passively  Denies gait abnormality left arm involvement   PHYSICAL EXAM:   Vitals:   04/21/23 1057  BP: (!) 142/90  Pulse: 87  Weight: 167 lb 3.2 oz (75.8 kg)  Height: 5\' 9"  (1.753 m)   Body mass index is 24.69 kg/m.  PHYSICAL EXAMNIATION:  Gen: NAD, conversant, well nourised, well groomed                     Cardiovascular: Regular rate rhythm, no peripheral edema, warm, nontender. Eyes: Conjunctivae clear without exudates or hemorrhage Neck: Supple, no carotid bruits. Pulmonary: Clear to  auscultation bilaterally   NEUROLOGICAL EXAM:  MENTAL STATUS: Speech/cognition: Awake, alert, oriented to history taking and casual conversation CRANIAL NERVES: CN II: Visual fields are full to confrontation. Pupils are round equal and briskly reactive to light. CN III, IV, VI: extraocular movement are normal. No ptosis. CN V: Facial sensation is intact to light touch CN VII: Face is symmetric with normal eye closure  CN VIII: Hearing is normal to causal conversation. CN IX, X: Phonation is normal. CN XI: Head turning and shoulder shrug are intact  MOTOR: There is no pronator drift of out-stretched arms. Muscle bulk and tone are normal. Muscle strength is normal.  REFLEXES: Reflexes are 2+ and symmetric at the biceps, triceps, knees, and ankles. Plantar responses are flexor.  SENSORY: Intact to light touch, pinprick and vibratory sensation are intact in fingers and toes.  COORDINATION: There is no trunk or limb dysmetria noted.  GAIT/STANCE: Posture is normal. Gait is steady with normal steps, base, arm swing, and turning. Heel and toe walking are normal. Tandem gait is normal.  Romberg is absent.  REVIEW OF SYSTEMS:  Full 14 system review of systems performed and notable only for as above All other review of systems were negative.   ALLERGIES: No Known Allergies  HOME MEDICATIONS: Current Outpatient Medications  Medication Sig Dispense Refill   acetaminophen (TYLENOL) 500 MG tablet Take 2 tablets (1,000 mg total) by mouth every 6 (six) hours as needed for moderate pain or mild pain.  30 tablet 0   atorvastatin (LIPITOR) 10 MG tablet Take 10 mg by mouth at bedtime.     Cyanocobalamin (B-12) 1000 MCG CAPS Take 1 capsule by mouth daily.     hydrALAZINE (APRESOLINE) 25 MG tablet Take 1 tablet (25 mg total) by mouth 3 (three) times daily. 90 tablet 0   Liothyronine Sodium (CYTOMEL PO) Take 15 mcg by mouth daily.     lisinopril (ZESTRIL) 5 MG tablet Take 5 mg by mouth at  bedtime.     metFORMIN (GLUCOPHAGE) 500 MG tablet Take 500 mg by mouth at bedtime.     methocarbamol (ROBAXIN) 500 MG tablet Take 1 tablet (500 mg total) by mouth 2 (two) times daily as needed for muscle spasms. 10 tablet 0   Multiple Vitamin (MULTIVITAMIN WITH MINERALS) TABS tablet Take 1 tablet by mouth daily.     naproxen (NAPROSYN) 500 MG tablet Take 1 tablet (500 mg total) by mouth 2 (two) times daily. 30 tablet 0   Probiotic Product (CULTURELLE PROBIOTICS PO) Take 1 capsule by mouth at bedtime. Brand-- Bluesky     progesterone (PROMETRIUM) 200 MG capsule Take 200 mg by mouth at bedtime.     No current facility-administered medications for this visit.    PAST MEDICAL HISTORY: Past Medical History:  Diagnosis Date   B12 deficiency anemia    CKD (chronic kidney disease), stage III (HCC)    Frequency of urination    History of adenomatous polyp of colon    History of COVID-19 07/2021   per pt mild symptoms that resolved   Hyperlipidemia    Hypertension    Hypothyroidism    followed by pcp   PONV (postoperative nausea and vomiting)    Type 2 diabetes mellitus (HCC)    followed by pcp   (05-01-2022  per pt checks blood sugar twice per week, fasting average sugar -- 120s)   UTI (urinary tract infection)    per pt dx 04-28-2022  started antibiotic 04-28-2022 for 7 days    PAST SURGICAL HISTORY: Past Surgical History:  Procedure Laterality Date   CATARACT EXTRACTION W/ INTRAOCULAR LENS IMPLANT Bilateral 2018   COLONOSCOPY  07/2020   FOOT ARTHRODESIS Right 05/06/2022   Procedure: FIRST METATARSAL FIRST CUNEIFORM FUSION WITH LAPOPLASTY RIGHT FOOT;  Surgeon: Larey Dresser, DPM;  Location: [facility];  Service: Podiatry;  Laterality: Right;  IBLOCK   LAPAROSCOPIC ASSISTED VAGINAL HYSTERECTOMY Bilateral 03/16/2013   @[facility] ;   W/ BILATERAL SALPINGOOPHORECTOMY   LUMBAR SPINE SURGERY  2008   per pt removal spinal cord benign cyst between L4--5   TENOTOMY / FLEXOR TENDON  TRANSFER Right 05/06/2022   Procedure: TENOTOMY OPEN EXTENSOR FOOT OR SECOND TOE;  Surgeon: Larey Dresser, DPM;  Location: [facility];  Service: Podiatry;  Laterality: Right;   URETER SURGERY     age 62;  per pt told had replacement  (? if re-implantation)    FAMILY HISTORY: History reviewed. No pertinent family history.  SOCIAL HISTORY: Social History   Socioeconomic History   Marital status: Married    Spouse name: Not on file   Number of children: 2   Years of education: Not on file   Highest education level: Not on file  Occupational History   Not on file  Tobacco Use   Smoking status: Never   Smokeless tobacco: Never  Vaping Use   Vaping Use: Never used  Substance and Sexual Activity   Alcohol use: Yes    Alcohol/week: 3.0  standard drinks of alcohol    Types: 3 Glasses of wine per week   Drug use: Never   Sexual activity: Yes    Birth control/protection: Post-menopausal, Surgical  Other Topics Concern   Not on file  Social History Narrative   Not on file   Social Determinants of Health   Financial Resource Strain: Not on file  Food Insecurity: Not on file  Transportation Needs: Not on file  Physical Activity: Not on file  Stress: Not on file  Social Connections: Not on file  Intimate Partner Violence: Not on file      Levert Feinstein, M.D. Ph.D.  [facility] [address] [place], [place] [address] Ph: [phone] Fax: [phone]  CC:  Glyn Ade, MD [address] [place],  [place] [address]  Pcp, No

## 2023-04-21 NOTE — Telephone Encounter (Signed)
[employer] medicare NPR sent to GI [phone] 

## 2023-04-28 ENCOUNTER — Ambulatory Visit: Payer: Medicare Other | Admitting: Diagnostic Neuroimaging

## 2023-04-29 ENCOUNTER — Ambulatory Visit
Admission: RE | Admit: 2023-04-29 | Discharge: 2023-04-29 | Disposition: A | Discharge status: Home / Self Care | Payer: Medicare Other | Source: Ambulatory Visit | Attending: Neurology | Admitting: Neurology

## 2023-04-29 DIAGNOSIS — M542 Cervicalgia: Secondary | ICD-10-CM

## 2023-05-03 ENCOUNTER — Other Ambulatory Visit: Payer: Medicare Other

## 2023-06-22 ENCOUNTER — Encounter: Payer: Self-pay | Admitting: Neurology

## 2023-06-22 ENCOUNTER — Ambulatory Visit: Payer: Medicare Other | Admitting: Neurology

## 2023-06-22 VITALS — BP 117/73 | HR 90 | Ht 69.0 in | Wt 163.0 lb

## 2023-06-22 DIAGNOSIS — M5412 Radiculopathy, cervical region: Secondary | ICD-10-CM

## 2023-06-22 DIAGNOSIS — R3915 Urgency of urination: Secondary | ICD-10-CM

## 2023-06-22 DIAGNOSIS — R269 Unspecified abnormalities of gait and mobility: Secondary | ICD-10-CM

## 2023-06-22 DIAGNOSIS — R292 Abnormal reflex: Secondary | ICD-10-CM | POA: Insufficient documentation

## 2023-06-22 NOTE — Progress Notes (Signed)
Chief Complaint  Patient presents with   Follow-up    Rm 15, alone, MRI Results and treatment      ASSESSMENT AND PLAN  Mariah Russell is a 67 y.o. female   Acute onset of right neck pain, radiating pain to right shoulder, Gait abnormality, urinary urgency, incontinence,  MRI of cervical spine cervical spine showed multilevel degenerative changes, most obvious C5-6, with moderate right foraminal narrowing, C6-7, severe right foraminal narrowing, potential encroachment on the right C7 nerve roots, no canal stenosis  EMG nerve conduction study for further evaluation,  But above findings would not explain her few months history of gait concern, urinary urgency to the point of incontinence, hyperreflexia bilateral Babinski signs, add on MRI thoracic  DIAGNOSTIC DATA (LABS, IMAGING, TESTING) - I reviewed patient records, labs, notes, testing and imaging myself where available.   MEDICAL HISTORY:  Mariah Russell, is a 67 year old female seen in request by her primary care physician Dr. Doran Durand, Almeta Monas, for evaluation of right neck pain, initial evaluation was on April 21, 2023    I reviewed and summarized the referring note. 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, 2023, noticed right neck pain, radiating pain towards right shoulder, surrounding her right shoulder socket, it was so severe, burning sensation, as if she pulled a muscle, no rash broke out, difficulty sleeping, eventually went to emergency room a week later on March 10, 2023, was given NSAIDs, and then referred to neurology clinic  Over the past few weeks, her shoulder pain has much improved, has intermittent numbness tingling, trouble along her right arm, sometimes she feels her right hand curled up, have to straighten out passively  Denies gait abnormality left arm involvement  UPDATE June 22 2023: She has longer has neck pain, still have a  lot of numbness of her right arm right hands, cramping with exertion sometimes, also noticed few months history of unsteady gait, worsening urinary urgency to the point of incontinence, denies left-sided symptoms  Personally reviewed MRI cervical spine on Apr 29, 2023, multilevel degenerative changes, most prominent C5-6, moderate right-sided foraminal narrowing, C6-7, severe right-sided foraminal narrowing with possible encroachment of right C7 nerve roots, no significant canal stenosis  Above findings would not explain her hyperreflexia bilateral Babinski signs unsteady gait, urinary urgency, discussed with patient, we will keep EMG nerve conduction study appointment, also add on MRI of thoracic spine   PHYSICAL EXAM:   Vitals:   06/22/23 0946  BP: 117/73  Pulse: 90  Weight: 163 lb (73.9 kg)  Height: 5\' 9"  (1.753 m)   Body mass index is 24.07 kg/m.  PHYSICAL EXAMNIATION:  Gen: NAD, conversant, well nourised, well groomed                     Cardiovascular: Regular rate rhythm, no peripheral edema, warm, nontender. Eyes: Conjunctivae clear without exudates or hemorrhage Neck: Supple, no carotid bruits. Pulmonary: Clear to auscultation bilaterally   NEUROLOGICAL EXAM:  MENTAL STATUS: Speech/cognition: Awake, alert, oriented to history taking and casual conversation CRANIAL NERVES: CN II: Visual fields are full to confrontation. Pupils are round equal and briskly reactive to light. CN III, IV, VI: extraocular movement are normal. No ptosis. CN V: Facial sensation is intact to light touch CN VII: Face is symmetric with normal eye closure  CN VIII: Hearing is normal to causal conversation. CN IX, X: Phonation is normal. CN XI:  Head turning and shoulder shrug are intact  MOTOR: Mild right shoulder abduction, external rotation weakness,  REFLEXES: Reflexes are brisk, mildly asymmetric R/L at the biceps  2/3, triceps 2/3, brachioradialis 1/3, knees 3/3, and ankles. Plantar  responses are extensor bilaterally  SENSORY: Intact to light touch, pinprick and vibratory sensation are intact in fingers and toes.  COORDINATION: There is no trunk or limb dysmetria noted.  GAIT/STANCE: Posture is normal. Gait is steady   REVIEW OF SYSTEMS:  Full 14 system review of systems performed and notable only for as above All other review of systems were negative.   ALLERGIES: No Known Allergies  HOME MEDICATIONS: Current Outpatient Medications  Medication Sig Dispense Refill   acetaminophen (TYLENOL) 500 MG tablet Take 2 tablets (1,000 mg total) by mouth every 6 (six) hours as needed for moderate pain or mild pain. 30 tablet 0   atorvastatin (LIPITOR) 10 MG tablet Take 10 mg by mouth at bedtime.     hydrALAZINE (APRESOLINE) 25 MG tablet Take 1 tablet (25 mg total) by mouth 3 (three) times daily. 90 tablet 0   Liothyronine Sodium (CYTOMEL PO) Take 15 mcg by mouth daily.     lisinopril (ZESTRIL) 5 MG tablet Take 5 mg by mouth at bedtime.     metFORMIN (GLUCOPHAGE) 500 MG tablet Take 500 mg by mouth at bedtime.     methocarbamol (ROBAXIN) 500 MG tablet Take 1 tablet (500 mg total) by mouth 2 (two) times daily as needed for muscle spasms. 10 tablet 0   naproxen (NAPROSYN) 500 MG tablet Take 1 tablet (500 mg total) by mouth 2 (two) times daily. 30 tablet 0   Probiotic Product (CULTURELLE PROBIOTICS PO) Take 1 capsule by mouth at bedtime. Brand-- Bluesky     progesterone (PROMETRIUM) 200 MG capsule Take 200 mg by mouth at bedtime.     Cyanocobalamin (B-12) 1000 MCG CAPS Take 1 capsule by mouth daily. (Patient not taking: Reported on 06/22/2023)     Multiple Vitamin (MULTIVITAMIN WITH MINERALS) TABS tablet Take 1 tablet by mouth daily. (Patient not taking: Reported on 06/22/2023)     No current facility-administered medications for this visit.    PAST MEDICAL HISTORY: Past Medical History:  Diagnosis Date   B12 deficiency anemia    CKD (chronic kidney disease), stage III  (HCC)    Frequency of urination    History of adenomatous polyp of colon    History of COVID-19 07/2021   per pt mild symptoms that resolved   Hyperlipidemia    Hypertension    Hypothyroidism    followed by pcp   PONV (postoperative nausea and vomiting)    Type 2 diabetes mellitus (HCC)    followed by pcp   (05-01-2022  per pt checks blood sugar twice per week, fasting average sugar -- 120s)   UTI (urinary tract infection)    per pt dx 04-28-2022  started antibiotic 04-28-2022 for 7 days    PAST SURGICAL HISTORY: Past Surgical History:  Procedure Laterality Date   CATARACT EXTRACTION W/ INTRAOCULAR LENS IMPLANT Bilateral 2018   COLONOSCOPY  07/2020   FOOT ARTHRODESIS Right 05/06/2022   Procedure: FIRST METATARSAL FIRST CUNEIFORM FUSION WITH LAPOPLASTY RIGHT FOOT;  Surgeon: Larey Dresser, DPM;  Location: [facility];  Service: Podiatry;  Laterality: Right;  IBLOCK   LAPAROSCOPIC ASSISTED VAGINAL HYSTERECTOMY Bilateral 03/16/2013   @[facility] ;   W/ BILATERAL SALPINGOOPHORECTOMY   LUMBAR SPINE SURGERY  2008   per pt removal spinal cord benign cyst  between L4--5   TENOTOMY / FLEXOR TENDON TRANSFER Right 05/06/2022   Procedure: TENOTOMY OPEN EXTENSOR FOOT OR SECOND TOE;  Surgeon: Larey Dresser, DPM;  Location: [facility];  Service: Podiatry;  Laterality: Right;   URETER SURGERY     age 9;  per pt told had replacement  (? if re-implantation)    FAMILY HISTORY: History reviewed. No pertinent family history.  SOCIAL HISTORY: Social History   Socioeconomic History   Marital status: Married    Spouse name: Not on file   Number of children: 2   Years of education: Not on file   Highest education level: Not on file  Occupational History   Not on file  Tobacco Use   Smoking status: Never   Smokeless tobacco: Never  Vaping Use   Vaping Use: Never used  Substance and Sexual Activity   Alcohol use: Yes    Alcohol/week: 3.0 standard drinks of alcohol     Types: 3 Glasses of wine per week   Drug use: Never   Sexual activity: Yes    Birth control/protection: Post-menopausal, Surgical  Other Topics Concern   Not on file  Social History Narrative   Not on file   Social Determinants of Health   Financial Resource Strain: Not on file  Food Insecurity: Not on file  Transportation Needs: Not on file  Physical Activity: Not on file  Stress: Not on file  Social Connections: Not on file  Intimate Partner Violence: Not on file      Levert Feinstein, M.D. Ph.D.  [facility] [address] [place], [place] [address] Ph: [phone] Fax: [phone]  CC:  No referring provider defined for this encounter.  Patient, No Pcp Per

## 2023-06-24 ENCOUNTER — Telehealth: Payer: Self-pay | Admitting: Neurology

## 2023-06-24 NOTE — Telephone Encounter (Signed)
[employer] medicare NPR sent to GI [phone] 

## 2023-06-29 ENCOUNTER — Ambulatory Visit
Admission: RE | Admit: 2023-06-29 | Discharge: 2023-06-29 | Disposition: A | Discharge status: Home / Self Care | Payer: Medicare Other | Source: Ambulatory Visit | Attending: Neurology | Admitting: Neurology

## 2023-06-29 DIAGNOSIS — R269 Unspecified abnormalities of gait and mobility: Secondary | ICD-10-CM

## 2023-06-29 DIAGNOSIS — M5412 Radiculopathy, cervical region: Secondary | ICD-10-CM

## 2023-06-29 DIAGNOSIS — R3915 Urgency of urination: Secondary | ICD-10-CM

## 2023-06-29 DIAGNOSIS — R292 Abnormal reflex: Secondary | ICD-10-CM

## 2023-06-30 ENCOUNTER — Other Ambulatory Visit: Payer: Medicare Other

## 2023-07-24 ENCOUNTER — Ambulatory Visit: Payer: Medicare Other | Admitting: Neurology

## 2023-07-24 ENCOUNTER — Ambulatory Visit: Payer: Self-pay | Admitting: Neurology

## 2023-07-24 DIAGNOSIS — Z0289 Encounter for other administrative examinations: Secondary | ICD-10-CM

## 2023-07-24 DIAGNOSIS — R3915 Urgency of urination: Secondary | ICD-10-CM

## 2023-07-24 DIAGNOSIS — R292 Abnormal reflex: Secondary | ICD-10-CM | POA: Diagnosis not present

## 2023-07-24 DIAGNOSIS — M542 Cervicalgia: Secondary | ICD-10-CM

## 2023-07-24 NOTE — Progress Notes (Signed)
No chief complaint on file.     ASSESSMENT AND PLAN  Mariah Russell is a 67 y.o. female   Acute onset of right neck pain, radiating pain to right shoulder, Gait abnormality, urinary urgency, incontinence,  MRI of cervical spine cervical spine showed multilevel degenerative changes, most obvious C5-6, with moderate right foraminal narrowing, C6-7, severe right foraminal narrowing, potential encroachment on the right C7 nerve roots, no canal stenosis  EMG nerve conduction study showed no evidence of active cervical radiculopathy  She is very much concerned about her worsening urinary frequency, urgency and incontinence, does have hyperreflexia on examinations, we will proceed MRI of brain to rule out structural abnormality,  DIAGNOSTIC DATA (LABS, IMAGING, TESTING) - I reviewed patient records, labs, notes, testing and imaging myself where available.   MEDICAL HISTORY:  Mariah Russell, is a 67 year old female seen in request by her primary care physician Dr. Doran Durand, Mariah Russell, for evaluation of right neck pain, initial evaluation was on April 21, 2023    I reviewed and summarized the referring note. 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, 2023, noticed right neck pain, radiating pain towards right shoulder, surrounding her right shoulder socket, it was so severe, burning sensation, as if she pulled a muscle, no rash broke out, difficulty sleeping, eventually went to emergency room a week later on March 10, 2023, was given NSAIDs, and then referred to neurology clinic  Over the past few weeks, her shoulder pain has much improved, has intermittent numbness tingling, trouble along her right arm, sometimes she feels her right hand curled up, have to straighten out passively  Denies gait abnormality left arm involvement  UPDATE June 22 2023: She no has longer has neck pain, still have a lot of numbness of her  right arm right hands, cramping with exertion sometimes, also noticed few months history of unsteady gait, worsening urinary urgency to the point of incontinence, denies left-sided symptoms  Personally reviewed MRI cervical spine on Apr 29, 2023, multilevel degenerative changes, most prominent C5-6, moderate right-sided foraminal narrowing, C6-7, severe right-sided foraminal narrowing with possible encroachment of right C7 nerve roots, no significant canal stenosis  Above findings would not explain her hyperreflexia bilateral Babinski signs unsteady gait, urinary urgency, discussed with patient, we will keep EMG nerve conduction study appointment, also add on MRI of thoracic spine  UPDATE July 24 2023: She no longer having right arm or shoulder pain, but continues to have subjective heaviness weakness sensation at right arm, able to play tennis without much difficulty  Denies gait abnormality, but continued bothered by urinary urgency, to the point of urinary incontinence  Personally reviewed MRI of thoracic spine, mild degenerative changes no evidence of cord compression  EMG nerve conduction study today July 24, 2023 was essentially normal.  In specific, there is no evidence of active right cervical radiculopathy.   PHYSICAL EXAM: 117/77, pulse of 102,  PHYSICAL EXAMNIATION:  Gen: NAD, conversant, well nourised, well groomed                     Cardiovascular: Regular rate rhythm, no peripheral edema, warm, nontender. Eyes: Conjunctivae clear without exudates or hemorrhage Neck: Supple, no carotid bruits. Pulmonary: Clear to auscultation bilaterally   NEUROLOGICAL EXAM:  MENTAL STATUS: Speech/cognition: Awake, alert, oriented to history taking and casual conversation CRANIAL NERVES: CN II: Visual fields are full to confrontation. Pupils are round equal and  briskly reactive to light. CN III, IV, VI: extraocular movement are normal. No ptosis. CN V: Facial sensation is intact to  light touch CN VII: Face is symmetric with normal eye closure  CN VIII: Hearing is normal to causal conversation. CN IX, X: Phonation is normal. CN XI: Head turning and shoulder shrug are intact  MOTOR: Mild right shoulder abduction, external rotation weakness,  REFLEXES: Reflexes are brisk, mildly asymmetric R/L at the biceps  2/3, triceps 2/3, brachioradialis 2/3, knees 3/3, and ankles. Plantar responses are extensor bilaterally  SENSORY: Intact to light touch, pinprick and vibratory sensation are intact in fingers and toes.  COORDINATION: There is no trunk or limb dysmetria noted.  GAIT/STANCE: Posture is normal. Gait is steady   REVIEW OF SYSTEMS:  Full 14 system review of systems performed and notable only for as above All other review of systems were negative.   ALLERGIES: No Known Allergies  HOME MEDICATIONS: Current Outpatient Medications  Medication Sig Dispense Refill   acetaminophen (TYLENOL) 500 MG tablet Take 2 tablets (1,000 mg total) by mouth every 6 (six) hours as needed for moderate pain or mild pain. 30 tablet 0   atorvastatin (LIPITOR) 10 MG tablet Take 10 mg by mouth at bedtime.     Cyanocobalamin (B-12) 1000 MCG CAPS Take 1 capsule by mouth daily. (Patient not taking: Reported on 06/22/2023)     hydrALAZINE (APRESOLINE) 25 MG tablet Take 1 tablet (25 mg total) by mouth 3 (three) times daily. 90 tablet 0   Liothyronine Sodium (CYTOMEL PO) Take 15 mcg by mouth daily.     lisinopril (ZESTRIL) 5 MG tablet Take 5 mg by mouth at bedtime.     metFORMIN (GLUCOPHAGE) 500 MG tablet Take 500 mg by mouth at bedtime.     methocarbamol (ROBAXIN) 500 MG tablet Take 1 tablet (500 mg total) by mouth 2 (two) times daily as needed for muscle spasms. 10 tablet 0   Multiple Vitamin (MULTIVITAMIN WITH MINERALS) TABS tablet Take 1 tablet by mouth daily. (Patient not taking: Reported on 06/22/2023)     naproxen (NAPROSYN) 500 MG tablet Take 1 tablet (500 mg total) by mouth 2 (two)  times daily. 30 tablet 0   Probiotic Product (CULTURELLE PROBIOTICS PO) Take 1 capsule by mouth at bedtime. Brand-- Bluesky     progesterone (PROMETRIUM) 200 MG capsule Take 200 mg by mouth at bedtime.     No current facility-administered medications for this visit.    PAST MEDICAL HISTORY: Past Medical History:  Diagnosis Date   B12 deficiency anemia    CKD (chronic kidney disease), stage III (HCC)    Frequency of urination    History of adenomatous polyp of colon    History of COVID-19 07/2021   per pt mild symptoms that resolved   Hyperlipidemia    Hypertension    Hypothyroidism    followed by pcp   PONV (postoperative nausea and vomiting)    Type 2 diabetes mellitus (HCC)    followed by pcp   (05-01-2022  per pt checks blood sugar twice per week, fasting average sugar -- 120s)   UTI (urinary tract infection)    per pt dx 04-28-2022  started antibiotic 04-28-2022 for 7 days    PAST SURGICAL HISTORY: Past Surgical History:  Procedure Laterality Date   CATARACT EXTRACTION W/ INTRAOCULAR LENS IMPLANT Bilateral 2018   COLONOSCOPY  07/2020   FOOT ARTHRODESIS Right 05/06/2022   Procedure: FIRST METATARSAL FIRST CUNEIFORM FUSION WITH LAPOPLASTY RIGHT FOOT;  Surgeon:  Larey Dresser, DPM;  Location: [facility];  Service: Podiatry;  Laterality: Right;  IBLOCK   LAPAROSCOPIC ASSISTED VAGINAL HYSTERECTOMY Bilateral 03/16/2013   @[facility] ;   W/ BILATERAL SALPINGOOPHORECTOMY   LUMBAR SPINE SURGERY  2008   per pt removal spinal cord benign cyst between L4--5   TENOTOMY / FLEXOR TENDON TRANSFER Right 05/06/2022   Procedure: TENOTOMY OPEN EXTENSOR FOOT OR SECOND TOE;  Surgeon: Larey Dresser, DPM;  Location: [facility];  Service: Podiatry;  Laterality: Right;   URETER SURGERY     age 52;  per pt told had replacement  (? if re-implantation)    FAMILY HISTORY: No family history on file.  SOCIAL HISTORY: Social History   Socioeconomic History   Marital  status: Married    Spouse name: Not on file   Number of children: 2   Years of education: Not on file   Highest education level: Not on file  Occupational History   Not on file  Tobacco Use   Smoking status: Never   Smokeless tobacco: Never  Vaping Use   Vaping status: Never Used  Substance and Sexual Activity   Alcohol use: Yes    Alcohol/week: 3.0 standard drinks of alcohol    Types: 3 Glasses of wine per week   Drug use: Never   Sexual activity: Yes    Birth control/protection: Post-menopausal, Surgical  Other Topics Concern   Not on file  Social History Narrative   Not on file   Social Determinants of Health   Financial Resource Strain: Not on file  Food Insecurity: Not on file  Transportation Needs: Not on file  Physical Activity: Not on file  Stress: Not on file  Social Connections: Not on file  Intimate Partner Violence: Not on file      Levert Feinstein, M.D. Ph.D.  [facility] [address] [place], [place] [address] Ph: [phone] Fax: [phone]  CC:  No referring provider defined for this encounter.  Patient, No Pcp Per

## 2023-07-24 NOTE — Procedures (Signed)
Full Name: Mariah Russell Gender: Female MRN #: [id number] Date of Birth: 02/29/1956    Visit Date: 07/24/2023 08:59 Age: 67 Years Examining Physician: Dr. Levert Feinstein Referring Physician: Dr. Levert Feinstein Height: 5 feet 9 inch History: 67 year old right-handed female presenting with acute right neck pain,  Summary of the test: Nerve conduction study:  Bilateral median and ulnar sensory and motor responses were normal.  Bilateral median mixed response showed no significant abnormality.  Right radial sensory responses were within normal limit.  Electromyography:  Selective needle examinations of right upper and lower extremity extremity muscles and right cervical and lumbar paraspinal muscles were normal.   Conclusion: This is a normal study.  There is no electrodiagnostic evidence of large fiber peripheral neuropathy, or right cervical radiculopathy.    ------------------------------- Levert Feinstein, M.D. PhD  [facility] [address] [place], [place] [address] Tel: [phone] Fax: [phone]  Verbal informed consent was obtained from the patient, patient was informed of potential risk of procedure, including bruising, bleeding, hematoma formation, infection, muscle weakness, muscle pain, numbness, among others.        MNC    Nerve / Sites Muscle Latency Ref. Amplitude Ref. Rel Amp Segments Distance Velocity Ref. Area    ms ms mV mV %  cm m/s m/s mVms  R Median - APB     Wrist APB 3.2 ?4.4 4.9 ?4.0 100 Wrist - APB 7   15.4     Upper arm APB 8.5  4.4  88.8 Upper arm - Wrist 28 53 ?49 13.1  L Median - APB     Wrist APB 3.4 ?4.4 4.8 ?4.0 100 Wrist - APB 7   15.3     Upper arm APB 7.9  5.7  120 Upper arm - Wrist 25 56 ?49 20.5  R Ulnar - ADM     Wrist ADM 2.6 ?3.3 5.6 ?6.0 100 Wrist - ADM 7   20.3     B.Elbow ADM 4.9  5.0  89.5 B.Elbow - Wrist 13 57 ?49 19.3     A.Elbow ADM 7.8  4.2  83.7 A.Elbow - B.Elbow 16 54 ?49 17.8  L Ulnar - ADM      Wrist ADM 2.9 ?3.3 7.9 ?6.0 100 Wrist - ADM 7   29.8     B.Elbow ADM 4.7  7.8  98.5 B.Elbow - Wrist 11 59 ?49 28.5     A.Elbow ADM 7.5  6.9  87.7 A.Elbow - B.Elbow 19 68 ?49 25.9             SNC    Nerve / Sites Rec. Site Peak Lat Ref.  Amp Ref. Segments Distance Peak Diff Ref.    ms ms V V  cm ms ms  R Radial - Anatomical snuff box (Forearm)     Forearm Wrist 2.2 ?2.9 25 ?15 Forearm - Wrist 10    R Median, Ulnar - Transcarpal comparison     Median Palm Wrist 2.0 ?2.2 82 ?35 Median Palm - Wrist 8       Ulnar Palm Wrist 1.9 ?2.2 20 ?12 Ulnar Palm - Wrist 8          Median Palm - Ulnar Palm  0.1 ?0.4  L Median, Ulnar - Transcarpal comparison     Median Palm Wrist 1.9 ?2.2 72 ?35 Median Palm - Wrist 8       Ulnar Palm Wrist 2.1 ?2.2 21 ?12 Ulnar Palm - Wrist  8          Median Palm - Ulnar Palm  -0.1 ?0.4  R Median - Orthodromic (Dig II, Mid palm)     Dig II Wrist 3.0 ?3.4 15 ?10 Dig II - Wrist 13    L Median - Orthodromic (Dig II, Mid palm)     Dig II Wrist 3.0 ?3.4 10 ?10 Dig II - Wrist 13    R Ulnar - Orthodromic, (Dig V, Mid palm)     Dig V Wrist 3.0 ?3.1 16 ?5 Dig V - Wrist 11    L Ulnar - Orthodromic, (Dig V, Mid palm)     Dig V Wrist 3.1 ?3.1 8 ?5 Dig V - Wrist 46                     F  Wave    Nerve F Lat Ref.   ms ms  R Ulnar - ADM 27.7 ?32.0  L Ulnar - ADM 26.8 ?32.0         EMG Summary Table    Spontaneous MUAP Recruitment  Muscle IA Fib PSW Fasc Other Amp Dur. Poly Pattern  R. First dorsal interosseous Normal None None None _______ Normal Normal Normal Normal  R. Pronator teres Normal None None None _______ Normal Normal Normal Normal  R. Biceps brachii Normal None None None _______ Normal Normal Normal Normal  R. Deltoid Normal None None None _______ Normal Normal Normal Normal  R. Triceps brachii Normal None None None _______ Normal Normal Normal Normal  R. Cervical paraspinals Normal None None None _______ Normal Normal Normal Normal  R. Tibialis anterior  Normal None None None _______ Normal Normal Normal Normal  R. Tibialis posterior Normal None None None _______ Normal Normal Normal Normal  R. Peroneus longus Normal None None None _______ Normal Normal Normal Normal  R. Gastrocnemius (Medial head) Normal None None None _______ Normal Normal Normal Normal  R. Vastus lateralis Normal None None None _______ Normal Normal Normal Normal  R. Lumbar paraspinals (low) Normal None None None _______ Normal Normal Normal Normal  R. Lumbar paraspinals (mid) Normal None None None _______ Normal Normal Normal Normal

## 2023-07-27 ENCOUNTER — Telehealth: Payer: Self-pay | Admitting: Neurology

## 2023-07-27 NOTE — Telephone Encounter (Signed)
[employer] medicare NPR sent to GI [phone] 

## 2023-08-18 ENCOUNTER — Ambulatory Visit
Admission: RE | Admit: 2023-08-18 | Discharge: 2023-08-18 | Disposition: A | Discharge status: Home / Self Care | Payer: Medicare Other | Source: Ambulatory Visit | Attending: Neurology | Admitting: Neurology

## 2023-08-18 DIAGNOSIS — M542 Cervicalgia: Secondary | ICD-10-CM

## 2023-08-18 DIAGNOSIS — R292 Abnormal reflex: Secondary | ICD-10-CM

## 2023-08-18 DIAGNOSIS — R3915 Urgency of urination: Secondary | ICD-10-CM
